# Patient Record
Sex: Female | Born: 1985 | Race: Black or African American | Hispanic: No | Marital: Single | Smoking: Former smoker
Health system: Southern US, Community
[De-identification: ages and names within clinical notes are randomized; demographics above are authoritative.]

## PROBLEM LIST (undated history)

## (undated) DIAGNOSIS — F419 Anxiety disorder, unspecified: Secondary | ICD-10-CM

---

## 2016-06-16 ENCOUNTER — Emergency Department (HOSPITAL_BASED_OUTPATIENT_CLINIC_OR_DEPARTMENT_OTHER)
Admission: EM | Admit: 2016-06-16 | Discharge: 2016-06-16 | Disposition: A | Discharge status: Home / Self Care | Payer: Medicaid Other | Attending: Emergency Medicine | Admitting: Emergency Medicine

## 2016-06-16 ENCOUNTER — Encounter (HOSPITAL_BASED_OUTPATIENT_CLINIC_OR_DEPARTMENT_OTHER): Payer: Self-pay | Admitting: Emergency Medicine

## 2016-06-16 DIAGNOSIS — R61 Generalized hyperhidrosis: Secondary | ICD-10-CM | POA: Diagnosis not present

## 2016-06-16 DIAGNOSIS — E876 Hypokalemia: Secondary | ICD-10-CM | POA: Diagnosis not present

## 2016-06-16 DIAGNOSIS — F17200 Nicotine dependence, unspecified, uncomplicated: Secondary | ICD-10-CM | POA: Diagnosis not present

## 2016-06-16 DIAGNOSIS — Z792 Long term (current) use of antibiotics: Secondary | ICD-10-CM | POA: Diagnosis not present

## 2016-06-16 DIAGNOSIS — R55 Syncope and collapse: Secondary | ICD-10-CM | POA: Insufficient documentation

## 2016-06-16 LAB — URINALYSIS, ROUTINE W REFLEX MICROSCOPIC
Bilirubin Urine: NEGATIVE
GLUCOSE, UA: NEGATIVE mg/dL
HGB URINE DIPSTICK: NEGATIVE
Ketones, ur: NEGATIVE mg/dL
Nitrite: NEGATIVE
PROTEIN: NEGATIVE mg/dL
SPECIFIC GRAVITY, URINE: 1.011 (ref 1.005–1.030)
pH: 7 (ref 5.0–8.0)

## 2016-06-16 LAB — BASIC METABOLIC PANEL
ANION GAP: 6 (ref 5–15)
BUN: 9 mg/dL (ref 6–20)
CHLORIDE: 106 mmol/L (ref 101–111)
CO2: 25 mmol/L (ref 22–32)
CREATININE: 0.88 mg/dL (ref 0.44–1.00)
Calcium: 9.2 mg/dL (ref 8.9–10.3)
GFR calc non Af Amer: >60 mL/min (ref >60)
Glucose, Bld: 99 mg/dL (ref 65–99)
POTASSIUM: 3.3 mmol/L — AB (ref 3.5–5.1)
SODIUM: 137 mmol/L (ref 135–145)

## 2016-06-16 LAB — CBC
HEMATOCRIT: 37.9 % (ref 36.0–46.0)
HEMOGLOBIN: 12.9 g/dL (ref 12.0–15.0)
MCH: 29.3 pg (ref 26.0–34.0)
MCHC: 34 g/dL (ref 30.0–36.0)
MCV: 85.9 fL (ref 78.0–100.0)
PLATELETS: 237 10*3/uL (ref 150–400)
RBC: 4.41 MIL/uL (ref 3.87–5.11)
RDW: 14.3 % (ref 11.5–15.5)
WBC: 4.5 10*3/uL (ref 4.0–10.5)

## 2016-06-16 LAB — URINE MICROSCOPIC-ADD ON

## 2016-06-16 LAB — CBG MONITORING, ED: Glucose-Capillary: 92 mg/dL (ref 65–99)

## 2016-06-16 LAB — HCG, QUANTITATIVE, PREGNANCY: hCG, Beta Chain, Quant, S: <1 m[IU]/mL (ref <5)

## 2016-06-16 MED ORDER — POTASSIUM CHLORIDE CRYS ER 20 MEQ PO TBCR
40.0000 meq | EXTENDED_RELEASE_TABLET | Freq: Once | ORAL | Status: AC
  Administered 2016-06-16: 40 meq via ORAL
  Filled 2016-06-16: qty 2

## 2016-06-16 MED ORDER — SODIUM CHLORIDE 0.9 % IV BOLUS (SEPSIS)
1000.0000 mL | Freq: Once | INTRAVENOUS | Status: AC
  Administered 2016-06-16: 1000 mL via INTRAVENOUS

## 2016-06-16 NOTE — ED Notes (Signed)
Pt is a visitor of another pt, states she was "feeling weird in the room," and walked outside to get some fresh air.  As she was on her way to her car, pt felt like she was going to pass out and was able to ask someone to get a nurse.  She was able to lie down into the grass and was brought into the department by the front desk staff.  Pt denies any injury, no injuries or trauma noted.  Pt denies previous syncopal episodes, states she has not eaten today.  She continues to c/o lightheadedness but is alert and oriented and able to actively participate in her care.  She was diaphoretic upon first assessment, is resting comfortably at this time.

## 2016-06-16 NOTE — ED Notes (Signed)
MD at bedside. 

## 2016-06-16 NOTE — ED Triage Notes (Signed)
Pt was walking out to her car in our parking lot when she states she became dizzy and hot and found herself lying in the grass.

## 2016-06-16 NOTE — ED Notes (Signed)
Pt verbalizes understanding of d/c instructions and denies any further needs at this time. 

## 2016-06-16 NOTE — ED Provider Notes (Signed)
[facility] Note   CSN: [id number] Arrival date & time: 06/16/16  1912  By signing my name below, I, Doreatha Martin, attest that this documentation has been prepared under the direction and in the presence of Rolan Bucco, MD. Electronically Signed: Doreatha Martin, ED Scribe. 06/16/16. 8:04 PM.    History   Chief Complaint Chief Complaint  Patient presents with  . Loss of Consciousness    HPI Theresa Ochoa is a 30 y.o. female who presents to the Emergency Department complaining of an episode of syncope that occurred just PTA. Pt states she was walking to her car in the ED parking lot when she became lightheaded, dizzy, diaphoretic, layed on the ground and lost consciousness for a short, unspecified, period of time. She denies falls or head injury. Pt states that she regained consciousness on the stretcher being wheeled inside. She states she had a "jittery" feeling prior to syncope, but denies palpitations of CP prior to the episode. No h/o similar syncopal episodes. Pt notes she occasionally experiences orthostatic lightheadedness for years at baseline and reports some dizziness when she smells certain chemicals. She denies known pregnancy. Pt states her periods are regular, LMP 05/28/16. She denies abdominal pain, nausea, emesis, diarrhea, recent illnesses.   The history is provided by the patient. No language interpreter was used.    History reviewed. No pertinent past medical history.  There are no active problems to display for this patient.   Past Surgical History:  Procedure Laterality Date  . CESAREAN SECTION      OB History    Gravida Para Term Preterm AB Living   1             SAB TAB Ectopic Multiple Live Births                   Home Medications    Prior to Admission medications   Medication Sig Start Date End Date Taking? Authorizing Provider  amoxicillin (AMOXIL) 500 MG capsule Take 500 mg by mouth 3 (three) times daily.   Yes Historical  Provider, MD    Family History No family history on file.  Social History Social History  Substance Use Topics  . Smoking status: Current Every Day Smoker  . Smokeless tobacco: Never Used  . Alcohol use No     Allergies   Review of patient's allergies indicates no known allergies.   Review of Systems Review of Systems  Constitutional: Positive for diaphoresis. Negative for chills, fatigue and fever.  HENT: Negative for congestion, rhinorrhea and sneezing.   Eyes: Negative.   Respiratory: Negative for cough, chest tightness and shortness of breath.   Cardiovascular: Negative for chest pain, palpitations and leg swelling.  Gastrointestinal: Negative for abdominal pain, blood in stool, diarrhea, nausea and vomiting.  Genitourinary: Negative for difficulty urinating, flank pain, frequency and hematuria.  Musculoskeletal: Negative for arthralgias and back pain.  Skin: Negative for rash.  Neurological: Positive for dizziness, syncope and light-headedness. Negative for speech difficulty, weakness, numbness and headaches.     Physical Exam Updated Vital Signs BP 101/70   Pulse 74   Temp 97.6 F (36.4 C) (Oral)   Resp 19   Ht 5\' 7"  (1.702 m)   Wt 135 lb (61.2 kg)   LMP 05/28/2016   SpO2 99%   Breastfeeding? Unknown   BMI 21.14 kg/m   Physical Exam  Constitutional: She is oriented to person, place, and time. She appears well-developed and well-nourished.  HENT:  Head:  Normocephalic and atraumatic.  Eyes: Pupils are equal, round, and reactive to light.  Neck: Normal range of motion. Neck supple.  Cardiovascular: Normal rate, regular rhythm and normal heart sounds.   Pulmonary/Chest: Effort normal and breath sounds normal. No respiratory distress. She has no wheezes. She has no rales. She exhibits no tenderness.  Abdominal: Soft. Bowel sounds are normal. There is no tenderness. There is no rebound and no guarding.  Musculoskeletal: Normal range of motion. She exhibits no  edema.  Lymphadenopathy:    She has no cervical adenopathy.  Neurological: She is alert and oriented to person, place, and time. She has normal strength. No cranial nerve deficit or sensory deficit. GCS eye subscore is 4. GCS verbal subscore is 5. GCS motor subscore is 6.  Skin: Skin is warm and dry. No rash noted.  Psychiatric: She has a normal mood and affect.  Nursing note and vitals reviewed.    ED Treatments / Results   DIAGNOSTIC STUDIES: Oxygen Saturation is 100% on RA, normal by my interpretation.    COORDINATION OF CARE: 8:03 PM Discussed treatment plan with pt at bedside which includes lab work and pt agreed to plan.    Labs (all labs ordered are listed, but only abnormal results are displayed) Labs Reviewed  BASIC METABOLIC PANEL - Abnormal; Notable for the following:       Result Value   Potassium 3.3 (*)    All other components within normal limits  URINALYSIS, ROUTINE W REFLEX MICROSCOPIC (NOT AT [facility]) - Abnormal; Notable for the following:    Leukocytes, UA TRACE (*)    All other components within normal limits  URINE MICROSCOPIC-ADD ON - Abnormal; Notable for the following:    Squamous Epithelial / LPF 0-5 (*)    Bacteria, UA FEW (*)    All other components within normal limits  CBC  HCG, QUANTITATIVE, PREGNANCY  CBG MONITORING, ED  CBG MONITORING, ED    EKG  EKG Interpretation  Date/Time:  Friday June 16 2016 19:27:36 EDT Ventricular Rate:  68 PR Interval:    QRS Duration: 89 QT Interval:  376 QTC Calculation: 400 R Axis:   75 Text Interpretation:  Sinus rhythm No old tracing to compare Confirmed by Chrisotpher Rivero  MD, Woodson Macha ([id number]) on 06/16/2016 7:30:38 PM       Radiology No results found.  Procedures Procedures (including critical care time)  Medications Ordered in ED Medications  potassium chloride SA (K-DUR,KLOR-CON) CR tablet 40 mEq (not administered)  sodium chloride 0.9 % bolus 1,000 mL (0 mLs Intravenous Stopped 06/16/16 2038)      Initial Impression / Assessment and Plan / ED Course  I have reviewed the triage vital signs and the nursing notes.  Pertinent labs & imaging results that were available during my care of the patient were reviewed by me and considered in my medical decision making (see chart for details).  Clinical Course    Patient presents after syncopal episode. She has no recent illnesses. No symptoms that sound more concerning for an arrhythmia or acute coronary syndrome. She was hypotensive on arrival but has responded nicely to IV fluids. She's feeling much better. She's ambulating without an times. She's been able to eat and drink without difficulty. Her labs are non-concerning. Her potassium is slightly low and she was given a dose of potassium in the ED. Her EKG doesn't show any evidence of arrhythmia. She was discharged home in good condition. She was encouraged to follow-up with her PCP. Return precautions  were given.  Final Clinical Impressions(s) / ED Diagnoses   Final diagnoses:  Vasovagal syncope  Hypokalemia    New Prescriptions New Prescriptions   No medications on file    I personally performed the services described in this documentation, which was scribed in my presence.  The recorded information has been reviewed and considered.    Rolan Bucco, MD 06/16/16 2159

## 2016-06-16 NOTE — ED Notes (Signed)
Pt verbalizes that she feels better.  She was wheeled to the restroom and was able to ambulate from the wheelchair to the toilet without assistance and states she felt fine.

## 2016-06-16 NOTE — ED Notes (Signed)
Pt sitting up and drinking water, eating crackers with cheese

## 2018-05-09 ENCOUNTER — Emergency Department (HOSPITAL_BASED_OUTPATIENT_CLINIC_OR_DEPARTMENT_OTHER)
Admission: EM | Admit: 2018-05-09 | Discharge: 2018-05-09 | Disposition: A | Discharge status: Home / Self Care | Payer: Medicaid Other | Attending: Emergency Medicine | Admitting: Emergency Medicine

## 2018-05-09 ENCOUNTER — Encounter (HOSPITAL_BASED_OUTPATIENT_CLINIC_OR_DEPARTMENT_OTHER): Payer: Self-pay | Admitting: Emergency Medicine

## 2018-05-09 ENCOUNTER — Other Ambulatory Visit: Payer: Self-pay

## 2018-05-09 DIAGNOSIS — F329 Major depressive disorder, single episode, unspecified: Secondary | ICD-10-CM | POA: Insufficient documentation

## 2018-05-09 DIAGNOSIS — R4586 Emotional lability: Secondary | ICD-10-CM | POA: Insufficient documentation

## 2018-05-09 DIAGNOSIS — F17200 Nicotine dependence, unspecified, uncomplicated: Secondary | ICD-10-CM | POA: Insufficient documentation

## 2018-05-09 DIAGNOSIS — F32A Depression, unspecified: Secondary | ICD-10-CM

## 2018-05-09 HISTORY — DX: Anxiety disorder, unspecified: F41.9

## 2018-05-09 LAB — CBC
HCT: 34 % — ABNORMAL LOW (ref 36.0–46.0)
HEMOGLOBIN: 11.4 g/dL — AB (ref 12.0–15.0)
MCH: 28.9 pg (ref 26.0–34.0)
MCHC: 33.5 g/dL (ref 30.0–36.0)
MCV: 86.3 fL (ref 78.0–100.0)
Platelets: 271 10*3/uL (ref 150–400)
RBC: 3.94 MIL/uL (ref 3.87–5.11)
RDW: 13.6 % (ref 11.5–15.5)
WBC: 4.8 10*3/uL (ref 4.0–10.5)

## 2018-05-09 LAB — COMPREHENSIVE METABOLIC PANEL
ALBUMIN: 4.4 g/dL (ref 3.5–5.0)
ALK PHOS: 45 U/L (ref 38–126)
ALT: 16 U/L (ref 0–44)
AST: 21 U/L (ref 15–41)
Anion gap: 9 (ref 5–15)
BUN: 8 mg/dL (ref 6–20)
CO2: 23 mmol/L (ref 22–32)
Calcium: 8.9 mg/dL (ref 8.9–10.3)
Chloride: 106 mmol/L (ref 98–111)
Creatinine, Ser: 0.78 mg/dL (ref 0.44–1.00)
GFR calc Af Amer: >60 mL/min (ref >60)
GFR calc non Af Amer: >60 mL/min (ref >60)
GLUCOSE: 81 mg/dL (ref 70–99)
POTASSIUM: 3.3 mmol/L — AB (ref 3.5–5.1)
Sodium: 138 mmol/L (ref 135–145)
Total Bilirubin: 0.3 mg/dL (ref 0.3–1.2)
Total Protein: 7.6 g/dL (ref 6.5–8.1)

## 2018-05-09 LAB — RAPID URINE DRUG SCREEN, HOSP PERFORMED
Amphetamines: NOT DETECTED
BARBITURATES: NOT DETECTED
Benzodiazepines: NOT DETECTED
Cocaine: NOT DETECTED
OPIATES: NOT DETECTED
TETRAHYDROCANNABINOL: POSITIVE — AB

## 2018-05-09 LAB — PREGNANCY, URINE: PREG TEST UR: NEGATIVE

## 2018-05-09 LAB — ETHANOL: Alcohol, Ethyl (B): 21 mg/dL — ABNORMAL HIGH (ref <10)

## 2018-05-09 NOTE — ED Triage Notes (Signed)
Pt c/o feeling depressed x 6 months. Reports feeling angry and irritable. Hx of anxiety but doesn't take medication for it. Denies SI/HI
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
## 2018-07-27 ENCOUNTER — Encounter (HOSPITAL_BASED_OUTPATIENT_CLINIC_OR_DEPARTMENT_OTHER): Payer: Self-pay | Admitting: Emergency Medicine

## 2018-07-27 ENCOUNTER — Emergency Department (HOSPITAL_BASED_OUTPATIENT_CLINIC_OR_DEPARTMENT_OTHER)
Admission: EM | Admit: 2018-07-27 | Discharge: 2018-07-27 | Disposition: A | Discharge status: Home / Self Care | Payer: Medicaid Other | Attending: Emergency Medicine | Admitting: Emergency Medicine

## 2018-07-27 ENCOUNTER — Other Ambulatory Visit: Payer: Self-pay

## 2018-07-27 DIAGNOSIS — Z3201 Encounter for pregnancy test, result positive: Secondary | ICD-10-CM | POA: Diagnosis not present

## 2018-07-27 DIAGNOSIS — O99330 Smoking (tobacco) complicating pregnancy, unspecified trimester: Secondary | ICD-10-CM | POA: Insufficient documentation

## 2018-07-27 DIAGNOSIS — F17200 Nicotine dependence, unspecified, uncomplicated: Secondary | ICD-10-CM | POA: Diagnosis not present

## 2018-07-27 DIAGNOSIS — Z3A00 Weeks of gestation of pregnancy not specified: Secondary | ICD-10-CM | POA: Insufficient documentation

## 2018-07-27 DIAGNOSIS — O219 Vomiting of pregnancy, unspecified: Secondary | ICD-10-CM | POA: Insufficient documentation

## 2018-07-27 DIAGNOSIS — R42 Dizziness and giddiness: Secondary | ICD-10-CM | POA: Diagnosis present

## 2018-07-27 LAB — URINALYSIS, ROUTINE W REFLEX MICROSCOPIC
Bilirubin Urine: NEGATIVE
Glucose, UA: NEGATIVE mg/dL
Hgb urine dipstick: NEGATIVE
KETONES UR: 15 mg/dL — AB
Leukocytes, UA: NEGATIVE
NITRITE: NEGATIVE
PROTEIN: NEGATIVE mg/dL
Specific Gravity, Urine: >1.03 — ABNORMAL HIGH (ref 1.005–1.030)
pH: 6 (ref 5.0–8.0)

## 2018-07-27 LAB — PREGNANCY, URINE: Preg Test, Ur: POSITIVE — AB

## 2018-07-27 NOTE — Discharge Instructions (Addendum)
Call your OB doctor to arrange appointment next week. Return to the ER if you develop abdominal pain, vaginal bleeding, passout or new or concerning symptoms.  Avoid alcohol or drugs.  Start taking prenatal vitamin.

## 2018-07-27 NOTE — ED Notes (Signed)
Pt reports not felling well for the past week and a half. Pt reports intermittent nausea and dizziness as well as general malaise. Pt denies any pain.

## 2018-07-27 NOTE — ED Triage Notes (Signed)
Patient states that at about 4 am - 5 am  she feels like she is throwing up and really hot. The patient reports that she is dizzy

## 2018-07-27 NOTE — ED Notes (Signed)
Patient verbalizes understanding of discharge instructions. Opportunity for questioning and answers were provided. Armband removed by staff, pt discharged from ED home via POV.  

## 2018-07-27 NOTE — ED Provider Notes (Signed)
[facility] EMERGENCY DEPARTMENT Provider Note   CSN: [id number] Arrival date & time: 07/27/18  1742     History   Chief Complaint Chief Complaint  Patient presents with  . Dizziness    HPI Theresa Ochoa is a 32 y.o. female.  Patient presents with malaise, nausea and vomiting for the past week.  No new headaches, no vaginal bleeding, no focal abdominal pain.  No urinary symptoms.  Patient has a history of 5 pregnancies, 2 abortions.  No ectopic history.     Past Medical History:  Diagnosis Date  . Anxiety     There are no active problems to display for this patient.   Past Surgical History:  Procedure Laterality Date  . CESAREAN SECTION    . CESAREAN SECTION       OB History    Gravida  1   Para      Term      Preterm      AB      Living        SAB      TAB      Ectopic      Multiple      Live Births               Home Medications    Prior to Admission medications   Medication Sig Start Date End Date Taking? Authorizing Provider  amoxicillin (AMOXIL) 500 MG capsule Take 500 mg by mouth 3 (three) times daily.    [provider]    Family History History reviewed. No pertinent family history.  Social History Social History   Tobacco Use  . Smoking status: Current Every Day Smoker  . Smokeless tobacco: Never Used  Substance Use Topics  . Alcohol use: Yes    Comment: occ  . Drug use: Yes    Types: Marijuana     Allergies   Patient has no known allergies.   Review of Systems Review of Systems  Constitutional: Positive for appetite change. Negative for chills and fever.  HENT: Negative for congestion.   Eyes: Negative for visual disturbance.  Respiratory: Negative for shortness of breath.   Cardiovascular: Negative for chest pain.  Gastrointestinal: Positive for nausea and vomiting. Negative for abdominal pain.  Genitourinary: Negative for dysuria and flank pain.  Musculoskeletal: Negative for back  pain, neck pain and neck stiffness.  Skin: Negative for rash.  Neurological: Positive for light-headedness. Negative for headaches.     Physical Exam Updated Vital Signs BP 126/90 (BP Location: Left Arm)   Pulse 84   Temp 98.5 F (36.9 C) (Oral)   Resp 18   Ht 5\' 7"  (1.702 m)   Wt 65.8 kg   LMP 06/09/2018   SpO2 100%   BMI 22.71 kg/m   Physical Exam  Constitutional: She is oriented to person, place, and time. She appears well-developed and well-nourished.  HENT:  Head: Normocephalic and atraumatic.  Mild dry mucous membranes  Eyes: Conjunctivae are normal. Right eye exhibits no discharge. Left eye exhibits no discharge.  Neck: Normal range of motion. Neck supple. No tracheal deviation present.  Cardiovascular: Normal rate.  Pulmonary/Chest: Effort normal.  Abdominal: Soft. She exhibits no distension. There is no tenderness. There is no guarding.  Musculoskeletal: She exhibits no edema.  Neurological: She is alert and oriented to person, place, and time.  Skin: Skin is warm. No rash noted.  Psychiatric: She has a normal mood and affect.  Nursing note and vitals reviewed.  ED Treatments / Results  Labs (all labs ordered are listed, but only abnormal results are displayed) Labs Reviewed  URINALYSIS, ROUTINE W REFLEX MICROSCOPIC - Abnormal; Notable for the following components:      Result Value   Specific Gravity, Urine >1.030 (*)    Ketones, ur 15 (*)    All other components within normal limits  PREGNANCY, URINE - Abnormal; Notable for the following components:   Preg Test, Ur POSITIVE (*)    All other components within normal limits    EKG None  Radiology No results found.  Procedures Procedures (including critical care time)  Medications Ordered in ED Medications - No data to display   Initial Impression / Assessment and Plan / ED Course  I have reviewed the triage vital signs and the nursing notes.  Pertinent labs & imaging results that were  available during my care of the patient were reviewed by me and considered in my medical decision making (see chart for details).    Patient presents with general malaise, urinalysis reviewed mild ketones no sign of infection.  Patient's pregnancy test is positive.  This is new information for the patient however now makes sense clinically for her.  Patient has a husband and is having no abdominal pain or vaginal bleeding.  Patient does not need any emergent imaging at this time.  Discussed follow-up with OB/GYN this week and reasons to return.  Final Clinical Impressions(s) / ED Diagnoses   Final diagnoses:  Vomiting pregnancy    ED Discharge Orders    None       Blane Ohara, MD 07/27/18 1943

## 2018-10-08 ENCOUNTER — Emergency Department (HOSPITAL_BASED_OUTPATIENT_CLINIC_OR_DEPARTMENT_OTHER)
Admission: EM | Admit: 2018-10-08 | Discharge: 2018-10-08 | Disposition: A | Discharge status: Home / Self Care | Payer: Medicaid Other | Source: Home / Self Care | Attending: Emergency Medicine | Admitting: Emergency Medicine

## 2018-10-08 ENCOUNTER — Encounter (HOSPITAL_BASED_OUTPATIENT_CLINIC_OR_DEPARTMENT_OTHER): Payer: Self-pay | Admitting: Emergency Medicine

## 2018-10-08 ENCOUNTER — Other Ambulatory Visit: Payer: Self-pay

## 2018-10-08 ENCOUNTER — Emergency Department (HOSPITAL_BASED_OUTPATIENT_CLINIC_OR_DEPARTMENT_OTHER): Payer: Medicaid Other

## 2018-10-08 ENCOUNTER — Emergency Department (HOSPITAL_BASED_OUTPATIENT_CLINIC_OR_DEPARTMENT_OTHER)
Admission: EM | Admit: 2018-10-08 | Discharge: 2018-10-08 | Disposition: A | Discharge status: Home / Self Care | Payer: Medicaid Other | Attending: Emergency Medicine | Admitting: Emergency Medicine

## 2018-10-08 DIAGNOSIS — R52 Pain, unspecified: Secondary | ICD-10-CM | POA: Diagnosis present

## 2018-10-08 DIAGNOSIS — J111 Influenza due to unidentified influenza virus with other respiratory manifestations: Secondary | ICD-10-CM

## 2018-10-08 DIAGNOSIS — R69 Illness, unspecified: Principal | ICD-10-CM

## 2018-10-08 DIAGNOSIS — Z87891 Personal history of nicotine dependence: Secondary | ICD-10-CM

## 2018-10-08 DIAGNOSIS — Z3A00 Weeks of gestation of pregnancy not specified: Secondary | ICD-10-CM

## 2018-10-08 DIAGNOSIS — O98519 Other viral diseases complicating pregnancy, unspecified trimester: Secondary | ICD-10-CM | POA: Insufficient documentation

## 2018-10-08 DIAGNOSIS — Z5321 Procedure and treatment not carried out due to patient leaving prior to being seen by health care provider: Secondary | ICD-10-CM | POA: Insufficient documentation

## 2018-10-08 DIAGNOSIS — Z209 Contact with and (suspected) exposure to unspecified communicable disease: Secondary | ICD-10-CM | POA: Insufficient documentation

## 2018-10-08 NOTE — Discharge Instructions (Signed)
Drink fluids,, rest, you can take Tylenol for aches and pains

## 2018-10-08 NOTE — ED Provider Notes (Signed)
[facility] EMERGENCY DEPARTMENT Provider Note   CSN: [id number] Arrival date & time: 10/08/18  0732     History   Chief Complaint Chief Complaint  Patient presents with  . flu-like symptoms    HPI Theresa Ochoa is a 32 y.o. female.  HPI Patient presents to the emergency room for evaluation of cough, congestion, body aches, and sore throat.  Patient states her symptoms started about a week ago.  She has had persistent cough and congestion.  She has myalgias.  She also now started having pain in her center of her chest especially with coughing.  She has not had any fevers and she denies any shortness of breath.  Patient stepchild is currently hospitalized in the ICU due to influenza and complications.  She denies any vomiting or diarrhea.  No known recent fevers. Past Medical History:  Diagnosis Date  . Anxiety     There are no active problems to display for this patient.   Past Surgical History:  Procedure Laterality Date  . CESAREAN SECTION    . CESAREAN SECTION       OB History    Gravida  2   Para      Term      Preterm      AB      Living        SAB      TAB      Ectopic      Multiple      Live Births               Home Medications    Prior to Admission medications   Medication Sig Start Date End Date Taking? Authorizing Provider  Prenatal Vit-Fe Fumarate-FA (PNV PRENATAL PLUS MULTIVITAMIN) 27-1 MG TABS Take by mouth.    [provider]    Family History No family history on file.  Social History Social History   Tobacco Use  . Smoking status: Former Games developermoker  . Smokeless tobacco: Never Used  Substance Use Topics  . Alcohol use: Yes    Comment: occ  . Drug use: Not Currently    Types: Marijuana     Allergies   Patient has no known allergies.   Review of Systems Review of Systems  All other systems reviewed and are negative.    Physical Exam Updated Vital Signs BP 99/69 (BP Location: Left Arm)    Pulse 88   Temp 98 F (36.7 C) (Oral)   Resp 16   Ht 1.702 m (5\' 7" )   Wt 75 kg   LMP 06/09/2018   SpO2 100%   BMI 25.90 kg/m   Physical Exam Vitals signs and nursing note reviewed.  Constitutional:      General: She is not in acute distress.    Appearance: She is well-developed.  HENT:     Head: Normocephalic and atraumatic.     Right Ear: External ear normal.     Left Ear: External ear normal.  Eyes:     General: No scleral icterus.       Right eye: No discharge.        Left eye: No discharge.     Conjunctiva/sclera: Conjunctivae normal.  Neck:     Musculoskeletal: Neck supple.     Trachea: No tracheal deviation.  Cardiovascular:     Rate and Rhythm: Normal rate and regular rhythm.  Pulmonary:     Effort: Pulmonary effort is normal. No respiratory distress.  Breath sounds: Normal breath sounds. No stridor. No wheezing or rales.  Abdominal:     General: Bowel sounds are normal. There is no distension.     Palpations: Abdomen is soft.     Tenderness: There is no abdominal tenderness. There is no guarding or rebound.     Comments: Gravid uterus  Musculoskeletal:        General: No tenderness.  Skin:    General: Skin is warm and dry.     Findings: No rash.  Neurological:     Mental Status: She is alert.     Cranial Nerves: No cranial nerve deficit (no facial droop, extraocular movements intact, no slurred speech).     Sensory: No sensory deficit.     Motor: No abnormal muscle tone or seizure activity.     Coordination: Coordination normal.      ED Treatments / Results  Labs (all labs ordered are listed, but only abnormal results are displayed) Labs Reviewed - No data to display  EKG None  Radiology Dg Chest 2 View  Result Date: 10/08/2018 CLINICAL DATA:  Chest pain and cough EXAM: CHEST - 2 VIEW COMPARISON:  None. FINDINGS: Lungs are clear. Heart size and pulmonary vascularity are normal. No adenopathy. No pneumothorax. No bone lesions. IMPRESSION: No  edema or consolidation. Electronically Signed   By: Bretta BangWilliam  Woodruff III M.D.   On: 10/08/2018 08:14    Procedures Procedures (including critical care time)  Medications Ordered in ED Medications - No data to display   Initial Impression / Assessment and Plan / ED Course  I have reviewed the triage vital signs and the nursing notes.  Pertinent labs & imaging results that were available during my care of the patient were reviewed by me and considered in my medical decision making (see chart for details).   Patient symptoms are consistent with an influenza-like illness.  No pneumonia on x-ray.  Patient is pregnant so she really cannot safely take medications for her cough.  She can take Tylenol for aches and pains.  Supportive care.  Final Clinical Impressions(s) / ED Diagnoses   Final diagnoses:  Influenza-like illness    ED Discharge Orders    None       Linwood DibblesKnapp, Seanmichael Salmons, MD 10/08/18 [phone]

## 2018-10-08 NOTE — ED Triage Notes (Signed)
Cough, sinus congestion, runny nose, sore throat, body aches x1 week.  Sts step-child is hospitalized currently for flu.

## 2018-10-08 NOTE — ED Notes (Signed)
Called to take to room  No response from lobby  

## 2018-10-08 NOTE — ED Notes (Signed)
Patient transported to X-ray 

## 2018-10-08 NOTE — ED Notes (Signed)
Pt called to take to treatment room  No response from lobby 

## 2018-10-08 NOTE — ED Triage Notes (Signed)
Pt c/o generalized body ache for the past few days not getting any better wanted know if she has the flu.

## 2018-10-09 NOTE — L&D Delivery Note (Signed)
OB/GYN Faculty Practice Delivery Note  Theresa Ochoa is a 33 y.o. [id number] s/p SVD at [redacted]w[redacted]d. She was admitted for IOL for IUFD in setting of presumed placental abruption and DIC.   ROM: 1h 72m with bloody fluid Maximum Maternal Temperature: Temp (24hrs), Avg:98.7 F (37.1 C), Min:98.2 F (36.8 C), Max:98.9 F (37.2 C)  Labor Progress: . Admitted for induction . Epidural placed . FB placed > out within a couple of hours . AROM, pitocin started  . Progressed to 9.5 and feeling lots of pressure  Delivery Date/Time: 11/16/18 at 0806 Delivery: Called to room and patient was feeling lots of pressure. Exam with fetal station of +2. Presenting part is left arm and shoulders, back up. Attempted to reduce arm, unsuccessful so had patient bear down and push. Body delivered with shoulders as presenting part. Rest of body delivered in usual fashion, large amount of old clot followed. Placenta delivered spontaneously with gentle cord traction. Fundus firm with massage and Pitocin. Labia, perineum, vagina, and cervix inspected inspected with no lacerations.   Placenta: spontaneous, intact, 3-vessel cord  Complications: DIC and worsening HgB of 6.5 immediately postpartum - receiving cryoprecipitate and ordered for 2U pRBCs to be given  methergine given with degree of blood clots, uterus firm with postpartum pitocin running  Lacerations: none EBL: 550cc Analgesia: epidural  Fetus: IUFD - family declines fetal autopsy, appreciate RN assistance with helping mother hold infant   Cristal Deer. Earlene Plater, DO OB/GYN Fellow, Faculty Practice

## 2018-11-15 ENCOUNTER — Encounter (HOSPITAL_COMMUNITY): Payer: Self-pay

## 2018-11-15 ENCOUNTER — Inpatient Hospital Stay (HOSPITAL_COMMUNITY)
Admission: EM | Admit: 2018-11-15 | Discharge: 2018-11-18 | DRG: 805 | Disposition: A | Discharge status: Home / Self Care | Payer: Medicaid Other | Attending: Obstetrics & Gynecology | Admitting: Obstetrics & Gynecology

## 2018-11-15 ENCOUNTER — Other Ambulatory Visit: Payer: Self-pay

## 2018-11-15 DIAGNOSIS — O4692 Antepartum hemorrhage, unspecified, second trimester: Secondary | ICD-10-CM

## 2018-11-15 DIAGNOSIS — Z3A25 25 weeks gestation of pregnancy: Secondary | ICD-10-CM

## 2018-11-15 DIAGNOSIS — O364XX0 Maternal care for intrauterine death, not applicable or unspecified: Principal | ICD-10-CM | POA: Diagnosis present

## 2018-11-15 DIAGNOSIS — N939 Abnormal uterine and vaginal bleeding, unspecified: Secondary | ICD-10-CM

## 2018-11-15 DIAGNOSIS — O4700 False labor before 37 completed weeks of gestation, unspecified trimester: Secondary | ICD-10-CM

## 2018-11-15 DIAGNOSIS — Z87891 Personal history of nicotine dependence: Secondary | ICD-10-CM

## 2018-11-15 DIAGNOSIS — D65 Disseminated intravascular coagulation [defibrination syndrome]: Secondary | ICD-10-CM | POA: Diagnosis present

## 2018-11-15 DIAGNOSIS — O9902 Anemia complicating childbirth: Secondary | ICD-10-CM | POA: Diagnosis present

## 2018-11-15 DIAGNOSIS — O479 False labor, unspecified: Secondary | ICD-10-CM

## 2018-11-15 DIAGNOSIS — D649 Anemia, unspecified: Secondary | ICD-10-CM | POA: Diagnosis present

## 2018-11-15 DIAGNOSIS — O99012 Anemia complicating pregnancy, second trimester: Secondary | ICD-10-CM | POA: Diagnosis present

## 2018-11-15 DIAGNOSIS — O4592 Premature separation of placenta, unspecified, second trimester: Secondary | ICD-10-CM | POA: Diagnosis present

## 2018-11-15 DIAGNOSIS — O45022 Premature separation of placenta with disseminated intravascular coagulation, second trimester: Secondary | ICD-10-CM | POA: Diagnosis present

## 2018-11-15 MED ORDER — SODIUM CHLORIDE 0.9 % IV BOLUS (SEPSIS)
1000.0000 mL | Freq: Once | INTRAVENOUS | Status: AC
  Administered 2018-11-15: 1000 mL via INTRAVENOUS

## 2018-11-15 MED ORDER — ACETAMINOPHEN 500 MG PO TABS
1000.0000 mg | ORAL_TABLET | Freq: Once | ORAL | Status: AC
  Administered 2018-11-15: 1000 mg via ORAL
  Filled 2018-11-15: qty 2

## 2018-11-15 NOTE — ED Triage Notes (Signed)
Pt here G4 P3 with EDD of May 22nd.  Started having contractions 2 hours ago.  States getting closer and closer. All previous delivers were via C Section.

## 2018-11-15 NOTE — ED Notes (Signed)
Rapid OB called  

## 2018-11-15 NOTE — ED Provider Notes (Addendum)
TIME SEEN: 11:29 PM  CHIEF COMPLAINT: Abdominal pain  HPI: Patient is a G4, P3 due on May 22 followed by Dr. Arther AbbottHenry Dorn who presents to the emergency department with abdominal pain.  States she feels like she is having contractions.  States that started 2 hours prior to arrival and they are 7 minutes apart and lasting 2 minutes.  She states they are regular.  Did have some increased clear vaginal discharge yesterday but no vaginal bleeding.  No abnormal discharge.  No dysuria, hematuria.  Denies leakage of amniotic fluid.  No fevers, nausea, vomiting or diarrhea.  She has been feeling her baby move.  States [facility] because [facility]  ROS: See HPI Constitutional: no fever  Eyes: no drainage  ENT: no runny nose   Cardiovascular:  no chest pain  Resp: no SOB  GI: no vomiting GU: no dysuria Integumentary: no rash  Allergy: no hives  Musculoskeletal: no leg swelling  Neurological: no slurred speech ROS otherwise negative  PAST MEDICAL HISTORY/PAST SURGICAL HISTORY:  Past Medical History:  Diagnosis Date  . Anxiety     MEDICATIONS:  Prior to Admission medications   Medication Sig Start Date End Date Taking? Authorizing Provider  Prenatal Vit-Fe Fumarate-FA (PNV PRENATAL PLUS MULTIVITAMIN) 27-1 MG TABS Take by mouth.    [provider]    ALLERGIES:  No Known Allergies  SOCIAL HISTORY:  Social History   Tobacco Use  . Smoking status: Former Games developermoker  . Smokeless tobacco: Never Used  Substance Use Topics  . Alcohol use: Yes    Comment: occ    FAMILY HISTORY: History reviewed. No pertinent family history.  EXAM: BP 116/71   Pulse 93   Temp 98.2 F (36.8 C) (Oral)   Resp (!) 27   LMP 06/09/2018   SpO2 100%  CONSTITUTIONAL: Alert and oriented and responds appropriately to questions.  Appears uncomfortable but not in distress, afebrile HEAD: Normocephalic EYES: Conjunctivae clear, pupils appear equal,  EOMI ENT: normal nose; moist mucous membranes NECK: Supple, no meningismus, no nuchal rigidity, no LAD  CARD: RRR; S1 and S2 appreciated; no murmurs, no clicks, no rubs, no gallops RESP: Normal chest excursion without splinting or tachypnea; breath sounds clear and equal bilaterally; no wheezes, no rhonchi, no rales, no hypoxia or respiratory distress, speaking full sentences ABD/GI: Normal bowel sounds; gravid uterus, soft, mildly tender to palpation throughout the lower abdomen, no peritoneal signs BACK:  The back appears normal and is non-tender to palpation, there is no CVA tenderness EXT: Normal ROM in all joints; non-tender to palpation; no edema; normal capillary refill; no cyanosis, no calf tenderness or swelling    SKIN: Normal color for age and race; warm; no rash NEURO: Moves all extremities equally PSYCH: The patient's mood and manner are appropriate. Grooming and personal hygiene are appropriate.  MEDICAL DECISION MAKING: Patient here with abdominal pain.  She feels like she is having contractions.  Patient is currently [redacted] weeks pregnant.  No discharge or bleeding.  Rapid OB response nurse has been called for evaluation.  Fetal heart tones in the 120s.  She is feeling fetal movement.  Will give IV fluids, Tylenol.  Will obtain urinalysis.  Blood pressure normal.  ED PROGRESS: Rapid OB nurse at bedside with patient.  Patient is having some contractions.  They are continuing IV hydration.  Per OB nurse patient is at a -3 station, 70% effaced, only fingertip dilated.   I  was called to the room emergently as patient began having vaginal bleeding.  On my sterile speculum exam she has moderate to severe amount of vaginal bleeding and passing clots but no obvious tissue.  Unfortunately she is bleeding so heavily that I am not able to visualize her cervical os.  I am able to see the top of the cervix.  Patient is unable to tolerate speculum exam without significant discomfort.  I did send a wet  prep and gonorrhea and Chlamydia cultures per OB recommendations.  Discussed my findings with the OB/GYN on-call Dr. Macon Large who recommend transferring the patient to [facility].  Patient is currently hemodynamically stable and [facility] is at bedside for transport.  Patient denies history of placenta previa.  I am concerned for preterm labor, placental abruption, placenta previa.  Given patient stable at this time, she will be transported emergently to [facility] where OB is present.   I reviewed all nursing notes, vitals, pertinent previous records, EKGs, lab and urine results, imaging (as available).      Syrenity Klepacki, Layla Maw, DO 11/16/18 0209   Addendum:  Note updated with my pelvic exam performed just prior to [facility] transporting patient to [facility] where OB present.  [facility] getting report from nurse as I performed exam to ensure patient stability prior to transport given patient reported to rapid response OB nurse that she felt she was bleeding heavily.  Patient immediately placed on [facility] stretcher as soon as exam complete and vitals reassessed to ensure patient stable.  OB attending was updated by myself immediately after my exam.  GU:  Normal external genitalia. No lesions, rashes noted. Patient has large amount of dark red blood with clots in vaginal canal.  Due to bleeding, I am unable to visualize cervical os.  No tissue seen in canal.  Patient has significant discomfort with pelvic exam.  Sterile speculum used.  No bimanual exam performed.  Chaperone present for exam.     Kenyon Eichelberger, Layla Maw, DO 11/16/18 9528    CRITICAL CARE Performed by: Baxter Hire Jevan Gaunt   Total critical care time: 50 minutes  Critical care time was exclusive of separately billable procedures and treating other patients.  Critical care was necessary to treat or prevent imminent or life-threatening deterioration.  Critical care was time spent personally by me on the following activities:  development of treatment plan with patient and/or surrogate as well as nursing, discussions with consultants, evaluation of patient's response to treatment, examination of patient, obtaining history from patient or surrogate, ordering and performing treatments and interventions, ordering and review of laboratory studies, ordering and review of radiographic studies, pulse oximetry and re-evaluation of patient's condition.    Briarrose Shor, Layla Maw, DO 11/25/18 [phone]

## 2018-11-16 ENCOUNTER — Encounter (HOSPITAL_COMMUNITY): Payer: Self-pay | Admitting: Family Medicine

## 2018-11-16 ENCOUNTER — Inpatient Hospital Stay (HOSPITAL_BASED_OUTPATIENT_CLINIC_OR_DEPARTMENT_OTHER): Payer: Medicaid Other

## 2018-11-16 ENCOUNTER — Inpatient Hospital Stay (HOSPITAL_COMMUNITY): Payer: Medicaid Other | Admitting: Anesthesiology

## 2018-11-16 DIAGNOSIS — Z3A25 25 weeks gestation of pregnancy: Secondary | ICD-10-CM

## 2018-11-16 DIAGNOSIS — D649 Anemia, unspecified: Secondary | ICD-10-CM | POA: Diagnosis present

## 2018-11-16 DIAGNOSIS — O45022 Premature separation of placenta with disseminated intravascular coagulation, second trimester: Secondary | ICD-10-CM | POA: Diagnosis present

## 2018-11-16 DIAGNOSIS — O364XX0 Maternal care for intrauterine death, not applicable or unspecified: Secondary | ICD-10-CM | POA: Diagnosis present

## 2018-11-16 DIAGNOSIS — O99012 Anemia complicating pregnancy, second trimester: Secondary | ICD-10-CM | POA: Diagnosis present

## 2018-11-16 DIAGNOSIS — Z87891 Personal history of nicotine dependence: Secondary | ICD-10-CM | POA: Diagnosis not present

## 2018-11-16 DIAGNOSIS — O4592 Premature separation of placenta, unspecified, second trimester: Secondary | ICD-10-CM | POA: Diagnosis present

## 2018-11-16 DIAGNOSIS — O9912 Other diseases of the blood and blood-forming organs and certain disorders involving the immune mechanism complicating childbirth: Secondary | ICD-10-CM | POA: Diagnosis not present

## 2018-11-16 DIAGNOSIS — O4692 Antepartum hemorrhage, unspecified, second trimester: Secondary | ICD-10-CM | POA: Diagnosis not present

## 2018-11-16 DIAGNOSIS — D65 Disseminated intravascular coagulation [defibrination syndrome]: Secondary | ICD-10-CM | POA: Diagnosis present

## 2018-11-16 DIAGNOSIS — O9902 Anemia complicating childbirth: Secondary | ICD-10-CM | POA: Diagnosis present

## 2018-11-16 DIAGNOSIS — O26892 Other specified pregnancy related conditions, second trimester: Secondary | ICD-10-CM

## 2018-11-16 DIAGNOSIS — R109 Unspecified abdominal pain: Secondary | ICD-10-CM | POA: Diagnosis present

## 2018-11-16 LAB — COMPREHENSIVE METABOLIC PANEL
ALBUMIN: 2.6 g/dL — AB (ref 3.5–5.0)
ALT: 26 U/L (ref 0–44)
ALT: 24 U/L (ref 0–44)
ALT: 22 U/L (ref 0–44)
AST: 27 U/L (ref 15–41)
AST: 27 U/L (ref 15–41)
AST: 26 U/L (ref 15–41)
Albumin: 2.7 g/dL — ABNORMAL LOW (ref 3.5–5.0)
Albumin: 2.5 g/dL — ABNORMAL LOW (ref 3.5–5.0)
Alkaline Phosphatase: 57 U/L (ref 38–126)
Alkaline Phosphatase: 66 U/L (ref 38–126)
Alkaline Phosphatase: 54 U/L (ref 38–126)
Anion gap: 7 (ref 5–15)
Anion gap: 6 (ref 5–15)
Anion gap: 5 (ref 5–15)
BUN: 13 mg/dL (ref 6–20)
BUN: 12 mg/dL (ref 6–20)
BUN: 7 mg/dL (ref 6–20)
CALCIUM: 7.8 mg/dL — AB (ref 8.9–10.3)
CHLORIDE: 106 mmol/L (ref 98–111)
CO2: 16 mmol/L — ABNORMAL LOW (ref 22–32)
CO2: 22 mmol/L (ref 22–32)
CO2: 23 mmol/L (ref 22–32)
Calcium: 8.1 mg/dL — ABNORMAL LOW (ref 8.9–10.3)
Calcium: 7.8 mg/dL — ABNORMAL LOW (ref 8.9–10.3)
Chloride: 111 mmol/L (ref 98–111)
Chloride: 108 mmol/L (ref 98–111)
Creatinine, Ser: 0.71 mg/dL (ref 0.44–1.00)
Creatinine, Ser: 0.69 mg/dL (ref 0.44–1.00)
Creatinine, Ser: 0.69 mg/dL (ref 0.44–1.00)
GFR calc Af Amer: >60 mL/min (ref >60)
GFR calc Af Amer: >60 mL/min (ref >60)
GFR calc Af Amer: >60 mL/min (ref >60)
GFR calc non Af Amer: >60 mL/min (ref >60)
GFR calc non Af Amer: >60 mL/min (ref >60)
GFR calc non Af Amer: >60 mL/min (ref >60)
Glucose, Bld: 94 mg/dL (ref 70–99)
Glucose, Bld: 102 mg/dL — ABNORMAL HIGH (ref 70–99)
Glucose, Bld: 89 mg/dL (ref 70–99)
Potassium: 3.9 mmol/L (ref 3.5–5.1)
Potassium: 3.8 mmol/L (ref 3.5–5.1)
Potassium: 3.6 mmol/L (ref 3.5–5.1)
Sodium: 134 mmol/L — ABNORMAL LOW (ref 135–145)
Sodium: 134 mmol/L — ABNORMAL LOW (ref 135–145)
Sodium: 136 mmol/L (ref 135–145)
Total Bilirubin: 0.8 mg/dL (ref 0.3–1.2)
Total Bilirubin: 0.7 mg/dL (ref 0.3–1.2)
Total Bilirubin: 0.4 mg/dL (ref 0.3–1.2)
Total Protein: 5.6 g/dL — ABNORMAL LOW (ref 6.5–8.1)
Total Protein: 5.5 g/dL — ABNORMAL LOW (ref 6.5–8.1)
Total Protein: 5.1 g/dL — ABNORMAL LOW (ref 6.5–8.1)

## 2018-11-16 LAB — DIC (DISSEMINATED INTRAVASCULAR COAGULATION)PANEL
D-Dimer, Quant: >20 ug/mL-FEU — ABNORMAL HIGH (ref 0.00–0.50)
D-Dimer, Quant: >20 ug/mL-FEU — ABNORMAL HIGH (ref 0.00–0.50)
D-Dimer, Quant: >20 ug/mL-FEU — ABNORMAL HIGH (ref 0.00–0.50)
Fibrinogen: 92 mg/dL — CL (ref 210–475)
Fibrinogen: 92 mg/dL — CL (ref 210–475)
Fibrinogen: 174 mg/dL — ABNORMAL LOW (ref 210–475)
INR: 1.26
INR: 1.21
INR: 1.15
Platelets: 108 10*3/uL — ABNORMAL LOW (ref 150–400)
Platelets: 98 10*3/uL — ABNORMAL LOW (ref 150–400)
Prothrombin Time: 15.7 seconds — ABNORMAL HIGH (ref 11.4–15.2)
Prothrombin Time: 15.2 seconds (ref 11.4–15.2)
Prothrombin Time: 14.6 seconds (ref 11.4–15.2)
Smear Review: NONE SEEN
aPTT: 38 seconds — ABNORMAL HIGH (ref 24–36)
aPTT: 38 seconds — ABNORMAL HIGH (ref 24–36)
aPTT: 31 seconds (ref 24–36)

## 2018-11-16 LAB — CBC
HCT: 29.3 % — ABNORMAL LOW (ref 36.0–46.0)
HCT: 25.5 % — ABNORMAL LOW (ref 36.0–46.0)
HCT: 19.9 % — ABNORMAL LOW (ref 36.0–46.0)
HEMATOCRIT: 23.2 % — AB (ref 36.0–46.0)
HEMOGLOBIN: 8 g/dL — AB (ref 12.0–15.0)
HEMOGLOBIN: 7.6 g/dL — AB (ref 12.0–15.0)
Hemoglobin: 9 g/dL — ABNORMAL LOW (ref 12.0–15.0)
Hemoglobin: 6.5 g/dL — CL (ref 12.0–15.0)
MCH: 26.4 pg (ref 26.0–34.0)
MCH: 27.2 pg (ref 26.0–34.0)
MCH: 27.4 pg (ref 26.0–34.0)
MCH: 27 pg (ref 26.0–34.0)
MCHC: 30.7 g/dL (ref 30.0–36.0)
MCHC: 31.4 g/dL (ref 30.0–36.0)
MCHC: 32.7 g/dL (ref 30.0–36.0)
MCHC: 32.8 g/dL (ref 30.0–36.0)
MCV: 85.9 fL (ref 80.0–100.0)
MCV: 86.7 fL (ref 80.0–100.0)
MCV: 84 fL (ref 80.0–100.0)
MCV: 82.6 fL (ref 80.0–100.0)
PLATELETS: 155 10*3/uL (ref 150–400)
Platelets: 127 10*3/uL — ABNORMAL LOW (ref 150–400)
Platelets: 100 10*3/uL — ABNORMAL LOW (ref 150–400)
Platelets: 85 10*3/uL — ABNORMAL LOW (ref 150–400)
RBC: 3.41 MIL/uL — ABNORMAL LOW (ref 3.87–5.11)
RBC: 2.94 MIL/uL — ABNORMAL LOW (ref 3.87–5.11)
RBC: 2.37 MIL/uL — ABNORMAL LOW (ref 3.87–5.11)
RBC: 2.81 MIL/uL — ABNORMAL LOW (ref 3.87–5.11)
RDW: 14.6 % (ref 11.5–15.5)
RDW: 14.9 % (ref 11.5–15.5)
RDW: 14.8 % (ref 11.5–15.5)
RDW: 14.8 % (ref 11.5–15.5)
WBC: 8.2 10*3/uL (ref 4.0–10.5)
WBC: 10.4 10*3/uL (ref 4.0–10.5)
WBC: 11.4 10*3/uL — ABNORMAL HIGH (ref 4.0–10.5)
WBC: 10 10*3/uL (ref 4.0–10.5)
nRBC: 0 % (ref 0.0–0.2)
nRBC: 0 % (ref 0.0–0.2)
nRBC: 0 % (ref 0.0–0.2)
nRBC: 0.2 % (ref 0.0–0.2)

## 2018-11-16 LAB — DIC (DISSEMINATED INTRAVASCULAR COAGULATION) PANEL: PLATELETS: 84 10*3/uL — AB (ref 150–400)

## 2018-11-16 LAB — RAPID URINE DRUG SCREEN, HOSP PERFORMED
Amphetamines: NOT DETECTED
Barbiturates: NOT DETECTED
Benzodiazepines: NOT DETECTED
Cocaine: NOT DETECTED
Opiates: NOT DETECTED
Tetrahydrocannabinol: POSITIVE — AB

## 2018-11-16 LAB — URINALYSIS, ROUTINE W REFLEX MICROSCOPIC
BILIRUBIN URINE: NEGATIVE
Glucose, UA: NEGATIVE mg/dL
Hgb urine dipstick: NEGATIVE
Ketones, ur: NEGATIVE mg/dL
Leukocytes, UA: NEGATIVE
Nitrite: NEGATIVE
PH: 6 (ref 5.0–8.0)
Protein, ur: NEGATIVE mg/dL
SPECIFIC GRAVITY, URINE: 1.014 (ref 1.005–1.030)

## 2018-11-16 LAB — WET PREP, GENITAL
Clue Cells Wet Prep HPF POC: NONE SEEN
Sperm: NONE SEEN
Trich, Wet Prep: NONE SEEN
Yeast Wet Prep HPF POC: NONE SEEN

## 2018-11-16 LAB — PREPARE RBC (CROSSMATCH)

## 2018-11-16 LAB — TSH: TSH: 1.053 u[IU]/mL (ref 0.350–4.500)

## 2018-11-16 LAB — ABO/RH: ABO/RH(D): O POS

## 2018-11-16 LAB — PROTIME-INR
INR: 1.06
PROTHROMBIN TIME: 13.7 s (ref 11.4–15.2)

## 2018-11-16 LAB — RPR: RPR Ser Ql: NONREACTIVE

## 2018-11-16 LAB — HEMOGLOBIN AND HEMATOCRIT, BLOOD
HCT: 24.9 % — ABNORMAL LOW (ref 36.0–46.0)
Hemoglobin: 8.3 g/dL — ABNORMAL LOW (ref 12.0–15.0)

## 2018-11-16 LAB — HEMOGLOBIN A1C
Hgb A1c MFr Bld: 5.7 % — ABNORMAL HIGH (ref 4.8–5.6)
Mean Plasma Glucose: 116.89 mg/dL

## 2018-11-16 LAB — APTT: aPTT: 31 seconds (ref 24–36)

## 2018-11-16 LAB — FIBRINOGEN: Fibrinogen: 208 mg/dL — ABNORMAL LOW (ref 210–475)

## 2018-11-16 MED ORDER — FENTANYL CITRATE (PF) 100 MCG/2ML IJ SOLN
100.0000 ug | Freq: Once | INTRAMUSCULAR | Status: DC
  Administered 2018-11-16: 100 ug via INTRAVENOUS

## 2018-11-16 MED ORDER — OXYTOCIN 40 UNITS IN NORMAL SALINE INFUSION - SIMPLE MED
2.5000 [IU]/h | INTRAVENOUS | Status: DC
  Administered 2018-11-16: 2.5 [IU]/h via INTRAVENOUS

## 2018-11-16 MED ORDER — ONDANSETRON HCL 4 MG PO TABS: 4.0000 mg | ORAL_TABLET | ORAL | Status: DC | PRN

## 2018-11-16 MED ORDER — FENTANYL 2.5 MCG/ML BUPIVACAINE 1/10 % EPIDURAL INFUSION (WH - ANES)
INTRAMUSCULAR | Status: DC | PRN
  Administered 2018-11-16: 14 mL/h via EPIDURAL

## 2018-11-16 MED ORDER — IBUPROFEN 600 MG PO TABS
600.0000 mg | ORAL_TABLET | Freq: Four times a day (QID) | ORAL | Status: DC
  Administered 2018-11-17 (×2): 600 mg via ORAL
  Filled 2018-11-16 (×2): qty 1

## 2018-11-16 MED ORDER — FLEET ENEMA 7-19 GM/118ML RE ENEM: 1.0000 | ENEMA | RECTAL | Status: DC | PRN

## 2018-11-16 MED ORDER — FENTANYL CITRATE (PF) 100 MCG/2ML IJ SOLN
INTRAMUSCULAR | Status: AC
  Administered 2018-11-16: 100 ug via INTRAVENOUS
  Filled 2018-11-16: qty 2

## 2018-11-16 MED ORDER — LACTATED RINGERS IV BOLUS
1000.0000 mL | Freq: Once | INTRAVENOUS | Status: AC
  Administered 2018-11-16: 1000 mL via INTRAVENOUS

## 2018-11-16 MED ORDER — OXYTOCIN BOLUS FROM INFUSION
500.0000 mL | Freq: Once | INTRAVENOUS | Status: AC
  Administered 2018-11-16: 500 mL via INTRAVENOUS

## 2018-11-16 MED ORDER — LIDOCAINE HCL (PF) 1 % IJ SOLN
30.0000 mL | INTRAMUSCULAR | Status: DC | PRN
  Filled 2018-11-16: qty 30

## 2018-11-16 MED ORDER — LIDOCAINE-EPINEPHRINE (PF) 2 %-1:200000 IJ SOLN
INTRAMUSCULAR | Status: DC | PRN
  Administered 2018-11-16: 3 mL via EPIDURAL

## 2018-11-16 MED ORDER — METHYLERGONOVINE MALEATE 0.2 MG/ML IJ SOLN
0.2000 mg | Freq: Once | INTRAMUSCULAR | Status: AC
  Administered 2018-11-16: 0.2 mg via INTRAMUSCULAR

## 2018-11-16 MED ORDER — ONDANSETRON HCL 4 MG/2ML IJ SOLN
4.0000 mg | Freq: Once | INTRAMUSCULAR | Status: AC
  Administered 2018-11-16: 4 mg via INTRAVENOUS
  Filled 2018-11-16: qty 2

## 2018-11-16 MED ORDER — COCONUT OIL OIL: 1.0000 "application " | TOPICAL_OIL | Status: DC | PRN

## 2018-11-16 MED ORDER — SENNOSIDES-DOCUSATE SODIUM 8.6-50 MG PO TABS
2.0000 | ORAL_TABLET | ORAL | Status: DC
  Filled 2018-11-16: qty 2

## 2018-11-16 MED ORDER — DIBUCAINE 1 % RE OINT: 1.0000 "application " | TOPICAL_OINTMENT | RECTAL | Status: DC | PRN

## 2018-11-16 MED ORDER — ACETAMINOPHEN 325 MG PO TABS
650.0000 mg | ORAL_TABLET | ORAL | Status: DC | PRN
  Administered 2018-11-16 – 2018-11-17 (×2): 650 mg via ORAL
  Filled 2018-11-16 (×2): qty 2

## 2018-11-16 MED ORDER — LACTATED RINGERS IV SOLN
INTRAVENOUS | Status: DC
  Administered 2018-11-16: 05:00:00 via INTRAVENOUS

## 2018-11-16 MED ORDER — METHYLERGONOVINE MALEATE 0.2 MG/ML IJ SOLN
INTRAMUSCULAR | Status: AC
  Filled 2018-11-16: qty 1

## 2018-11-16 MED ORDER — WITCH HAZEL-GLYCERIN EX PADS: 1.0000 "application " | MEDICATED_PAD | CUTANEOUS | Status: DC | PRN

## 2018-11-16 MED ORDER — MEASLES, MUMPS & RUBELLA VAC IJ SOLR: 0.5000 mL | Freq: Once | INTRAMUSCULAR | Status: DC

## 2018-11-16 MED ORDER — SODIUM CHLORIDE 0.9% IV SOLUTION
Freq: Once | INTRAVENOUS | Status: AC
  Administered 2018-11-16: 09:00:00 via INTRAVENOUS

## 2018-11-16 MED ORDER — ZOLPIDEM TARTRATE 5 MG PO TABS
5.0000 mg | ORAL_TABLET | Freq: Every evening | ORAL | Status: DC | PRN
  Administered 2018-11-16 – 2018-11-17 (×2): 5 mg via ORAL
  Filled 2018-11-16 (×2): qty 1

## 2018-11-16 MED ORDER — FENTANYL CITRATE (PF) 100 MCG/2ML IJ SOLN: 100.0000 ug | INTRAMUSCULAR | Status: DC | PRN

## 2018-11-16 MED ORDER — PRENATAL MULTIVITAMIN CH
1.0000 | ORAL_TABLET | Freq: Every day | ORAL | Status: DC
  Administered 2018-11-17: 1 via ORAL
  Filled 2018-11-16: qty 1

## 2018-11-16 MED ORDER — DIPHENHYDRAMINE HCL 25 MG PO CAPS: 25.0000 mg | ORAL_CAPSULE | Freq: Four times a day (QID) | ORAL | Status: DC | PRN

## 2018-11-16 MED ORDER — SOD CITRATE-CITRIC ACID 500-334 MG/5ML PO SOLN: 30.0000 mL | ORAL | Status: DC | PRN

## 2018-11-16 MED ORDER — OXYTOCIN 40 UNITS IN NORMAL SALINE INFUSION - SIMPLE MED
1.0000 m[IU]/min | INTRAVENOUS | Status: DC
  Administered 2018-11-16: 1 m[IU]/min via INTRAVENOUS

## 2018-11-16 MED ORDER — FENTANYL 2.5 MCG/ML BUPIVACAINE 1/10 % EPIDURAL INFUSION (WH - ANES)
INTRAMUSCULAR | Status: AC
  Filled 2018-11-16: qty 100

## 2018-11-16 MED ORDER — BENZOCAINE-MENTHOL 20-0.5 % EX AERO: 1.0000 "application " | INHALATION_SPRAY | CUTANEOUS | Status: DC | PRN

## 2018-11-16 MED ORDER — PHENYLEPHRINE 40 MCG/ML (10ML) SYRINGE FOR IV PUSH (FOR BLOOD PRESSURE SUPPORT)
PREFILLED_SYRINGE | INTRAVENOUS | Status: AC
  Filled 2018-11-16: qty 10

## 2018-11-16 MED ORDER — ONDANSETRON HCL 4 MG/2ML IJ SOLN: 4.0000 mg | Freq: Four times a day (QID) | INTRAMUSCULAR | Status: DC | PRN

## 2018-11-16 MED ORDER — ONDANSETRON HCL 4 MG/2ML IJ SOLN: 4.0000 mg | INTRAMUSCULAR | Status: DC | PRN

## 2018-11-16 MED ORDER — TETANUS-DIPHTH-ACELL PERTUSSIS 5-2.5-18.5 LF-MCG/0.5 IM SUSP: 0.5000 mL | Freq: Once | INTRAMUSCULAR | Status: DC

## 2018-11-16 MED ORDER — NIFEDIPINE 10 MG PO CAPS
10.0000 mg | ORAL_CAPSULE | Freq: Once | ORAL | Status: AC
  Administered 2018-11-16: 10 mg via ORAL
  Filled 2018-11-16: qty 1

## 2018-11-16 MED ORDER — SIMETHICONE 80 MG PO CHEW: 80.0000 mg | CHEWABLE_TABLET | ORAL | Status: DC | PRN

## 2018-11-16 MED ORDER — LACTATED RINGERS IV SOLN: 500.0000 mL | INTRAVENOUS | Status: DC | PRN

## 2018-11-16 MED ORDER — LORAZEPAM 1 MG PO TABS
0.5000 mg | ORAL_TABLET | Freq: Four times a day (QID) | ORAL | Status: DC | PRN
  Administered 2018-11-16 – 2018-11-18 (×2): 0.5 mg via ORAL
  Filled 2018-11-16 (×2): qty 1

## 2018-11-16 MED ORDER — SODIUM CHLORIDE 0.9% IV SOLUTION
Freq: Once | INTRAVENOUS | Status: AC
  Administered 2018-11-16: 08:00:00 via INTRAVENOUS

## 2018-11-16 NOTE — Progress Notes (Signed)
RROB ALSO USING DOPPLER TO FIND FHR, AT BEDSIDE IN MAU  WHILE RROB ON [facility] WITH PT, UNABLE TO GET A CONSISTENT FHR/UNABLE TO DETERMINE FHR....Marland KitchenMarland Kitchen

## 2018-11-16 NOTE — Anesthesia Procedure Notes (Signed)
Epidural Patient location during procedure: OB Start time: 11/16/2018 3:50 AM End time: 11/16/2018 4:05 AM  Staffing Anesthesiologist: Elmer Picker, MD Performed: anesthesiologist   Preanesthetic Checklist Completed: patient identified, pre-op evaluation, timeout performed, IV checked, risks and benefits discussed and monitors and equipment checked  Epidural Patient position: sitting Prep: site prepped and draped and DuraPrep Patient monitoring: continuous pulse ox, blood pressure, heart rate and cardiac monitor Approach: midline Location: L3-L4 Injection technique: LOR air  Needle:  Needle type: Tuohy  Needle gauge: 17 G Needle length: 9 cm Needle insertion depth: 4 cm Catheter type: closed end flexible Catheter size: 19 Gauge Catheter at skin depth: 10 cm Test dose: negative  Assessment Sensory level: T8 Events: blood not aspirated, injection not painful, no injection resistance, negative IV test and no paresthesia  Additional Notes Patient identified. Risks/Benefits/Options discussed with patient including but not limited to bleeding, infection, nerve damage, paralysis, failed block, incomplete pain control, headache, blood pressure changes, nausea, vomiting, reactions to medication both or allergic, itching and postpartum back pain. Confirmed with bedside nurse the patient's most recent platelet count. Confirmed with patient that they are not currently taking any anticoagulation, have any bleeding history or any family history of bleeding disorders. Patient expressed understanding and wished to proceed. All questions were answered. Sterile technique was used throughout the entire procedure. Please see nursing notes for vital signs. Test dose was given through epidural catheter and negative prior to continuing to dose epidural or start infusion. Warning signs of high block given to the patient including shortness of breath, tingling/numbness in hands, complete motor block, or  any concerning symptoms with instructions to call for help. Patient was given instructions on fall risk and not to get out of bed. All questions and concerns addressed with instructions to call with any issues or inadequate analgesia.  Reason for block:procedure for pain

## 2018-11-16 NOTE — Progress Notes (Signed)
DR WARD-ED PROVIDER, NOTIFIED DR Macon Large OF Josem Kaufmann EXAM FINDINGS

## 2018-11-16 NOTE — H&P (Addendum)
Obstetric History and Physical  Theresa Ochoa is a 33 y.o. [id number] with IUP at [redacted]w[redacted]d who presented to [facility] ER earlier with abdominal pain and contractions. Gets care with Dr. Shawnie Pons at [facility], history of three term cesarean sections, no placental previa or other abnormalities,  no other significant obstetric or medical history.  Initial evaluation showed cervical exam of FT/70/-3 with reported FHR in 120s by doppler. She initially had no bleeding, no leaking of fluid and patient reported fetal movement.   Her pain worsened while being evaluated in the [facility] ER and she was noted to be bleeding.  The FHR was reported to be stable, speculum exam was difficult given amount of blood seen but patient appeared hemodynamically stable. She was then urgently brought here via [facility] for further evaluation.  On arrival here at [facility], she was urgently taken to a room and FHR could not be auscultated with the doppler.  Urgent bedside formal scan revealed no fetal heartbeat, fetal cephalic presentation with posterior placenta; speculum exam done at the same time revealed big clots in vagina with bloody fluid noted. Low trickle of blood noted from os, cervix noted to be 1/70/-3, possible clot palpated in lower uterine segment.  Patient was told of about the fetal demise and placental abruption, she is appropriately sad. Of note, she denied any IVDA, cocaine use or other illicit substance use.  Patient currently reports having significant lower abdominal pain, ordered for Fentanyl.    Past Medical History:  Diagnosis Date  . Anxiety     Past Surgical History:  Procedure Laterality Date  . CESAREAN SECTION     x 3    OB History  Gravida Para Term Preterm AB Living  5 3 3   1 3   SAB TAB Ectopic Multiple Live Births               # Outcome Date GA Lbr Len/2nd Weight Sex Delivery Anes PTL Lv  5 Current           4 AB           3 Term           2 Term           1 Term             Social History   Socioeconomic  History  . Marital status: Single    Spouse name: Not on file  . Number of children: Not on file  . Years of education: Not on file  . Highest education level: Not on file  Occupational History  . Not on file  Social Needs  . Financial resource strain: Not on file  . Food insecurity:    Worry: Not on file    Inability: Not on file  . Transportation needs:    Medical: Not on file    Non-medical: Not on file  Tobacco Use  . Smoking status: Former Games developer  . Smokeless tobacco: Never Used  Substance and Sexual Activity  . Alcohol use: Yes    Comment: occ  . Drug use: Not Currently    Types: Marijuana  . Sexual activity: Yes    Comment: Pregnant  Lifestyle  . Physical activity:    Days per week: Not on file    Minutes per session: Not on file  . Stress: Not on file  Relationships  . Social connections:    Talks on phone: Not on file    Gets together: Not on  file    Attends religious service: Not on file    Active member of club or organization: Not on file    Attends meetings of clubs or organizations: Not on file    Relationship status: Not on file  Other Topics Concern  . Not on file  Social History Narrative  . Not on file    History reviewed. No pertinent family history.  No medications prior to admission.    No Known Allergies  Review of Systems: Negative except for what is mentioned in HPI.  Physical Exam: BP 129/74   Pulse 93   Temp 98.2 F (36.8 C) (Oral)   Resp (!) 27   LMP 06/09/2018   SpO2 100%  CONSTITUTIONAL: Well-developed, well-nourished female in with some pain.  HENT:  Normocephalic, atraumatic, External right and left ear normal. Oropharynx is clear and moist EYES: Conjunctivae and EOM are normal. Pupils are equal, round, and reactive to light. No scleral icterus.  NECK: Normal range of motion, supple, no masses SKIN: Skin is warm and dry. No rash noted. Not diaphoretic. No erythema. No pallor. NEUROLOGIC: Alert and oriented to person,  place, and time. Normal reflexes, muscle tone coordination. No cranial nerve deficit noted. PSYCHIATRIC: Sad mood and affect. Normal behavior. Normal judgment and thought content. CARDIOVASCULAR: Normal heart rate noted, regular rhythm RESPIRATORY: Effort and breath sounds normal, no problems with respiration noted ABDOMEN: Soft, moderate fundal tenderness, nondistended, gravid. MUSCULOSKELETAL: Normal range of motion. No edema and no tenderness. 2+ distal pulses.  Cervical Exam: As described above in HPI; 1/70/-3, slow trickle of blood Presentation: Cephalic on ultrasound Contractions: Every 2-3 mins   Pertinent Labs/Studies:   Results for orders placed or performed during the hospital encounter of 11/15/18 (from the past 24 hour(s))  CBC     Status: Abnormal   Collection Time: 11/16/18  1:02 AM  Result Value Ref Range   WBC 8.2 4.0 - 10.5 K/uL   RBC 3.41 (L) 3.87 - 5.11 MIL/uL   Hemoglobin 9.0 (L) 12.0 - 15.0 g/dL   HCT 40.929.3 (L) 81.136.0 - 91.446.0 %   MCV 85.9 80.0 - 100.0 fL   MCH 26.4 26.0 - 34.0 pg   MCHC 30.7 30.0 - 36.0 g/dL   RDW 78.214.6 95.611.5 - 21.315.5 %   Platelets 155 150 - 400 K/uL   nRBC 0.0 0.0 - 0.2 %  Wet prep, genital     Status: Abnormal   Collection Time: 11/16/18  1:48 AM  Result Value Ref Range   Yeast Wet Prep HPF POC NONE SEEN NONE SEEN   Trich, Wet Prep NONE SEEN NONE SEEN   Clue Cells Wet Prep HPF POC NONE SEEN NONE SEEN   WBC, Wet Prep HPF POC FEW (A) NONE SEEN   Sperm NONE SEEN   CBC     Status: Abnormal   Collection Time: 11/16/18  2:10 AM  Result Value Ref Range   WBC 10.4 4.0 - 10.5 K/uL   RBC 2.94 (L) 3.87 - 5.11 MIL/uL   Hemoglobin 8.0 (L) 12.0 - 15.0 g/dL   HCT 08.625.5 (L) 57.836.0 - 46.946.0 %   MCV 86.7 80.0 - 100.0 fL   MCH 27.2 26.0 - 34.0 pg   MCHC 31.4 30.0 - 36.0 g/dL   RDW 62.914.9 52.811.5 - 41.315.5 %   Platelets 127 (L) 150 - 400 K/uL   nRBC 0.0 0.0 - 0.2 %  Type and screen     Status: None (Preliminary result)   Collection Time:  11/16/18  2:10 AM  Result Value  Ref Range   ABO/RH(D) O POS    Antibody Screen PENDING    Sample Expiration      11/19/2018 Performed at [facility], [address] [place], [place] [address]     Assessment : Theresa Ochoa is a 33 y.o. G2P0 at [redacted]w[redacted]d being admitted for induction of labor in the setting of fetal demise and placental abruption (no active hemorrhage).  Plan: Will admit to L&D and start with foley bulb cervical ripening then pitocin per protocol.  If she starts to hemorrhage or become unstable, may need to proceed with repeat cesarean section.  She is type and crossmatched for 3 units of blood, has increased risk of hemorrhage and increased risk of needing surgery. OR team is aware.   Will keep NPO. Ordered fetal demise evaluation labs; will follow up results and manage accordingly. Patient declines chaplain services for now. Appropriate support to be given to patient and family. Continue close observation.    Jaynie Collins, MD, FACOG Obstetrician & Gynecologist, [facility] for [employer], [facility]

## 2018-11-16 NOTE — ED Notes (Signed)
[facility] called to transport to [facility]

## 2018-11-16 NOTE — Progress Notes (Addendum)
Faculty Note  Patient feeling okay, states she is having some cramping but minimal pain. Feels very "hurt" emotionally, but otherwise, physically, feeling well.   BP 121/73   Pulse 75   Temp 99.1 F (37.3 C) (Oral)   Resp 18   Ht 5\' 7"  (1.702 m)   LMP 06/09/2018   SpO2 99%   BMI 25.90 kg/m  Gen: alert, oriented, tearful Abd: soft, fundus firm at umbilicus GU: minimal bleeding  A/P: 48 [id number]. PPD#0 s/p VBAC for IUFD, abruption complicated by DIC. She has received 2 units pRBCs, 2 pools cryoprecipitate, 1 unit FFP. Her bleeding is minimal at this point and repeat labs stable. I reviewed her course with her and potential need for more blood products, she verbalizes understanding and is in agreement with plan.   Transfer to [facility] Pain meds prn Repeat labs for 1400, 2200   K. Therese Sarah, M.D. Attending Center for [employer] Midwife)

## 2018-11-16 NOTE — MAU Note (Signed)
Pt arrives via [facility] from [facility] with vaginal bleeding and abdominal pain. Dr. Macon Large at bedside and pelvic exam performed noting large amount of blood clots. Ultrasound at bedside, no fetal heart tones noted. IVFs started.

## 2018-11-16 NOTE — MAU Note (Signed)
[facility]. SSE by Dr. Macon LargeAnyanwu. Several large clots removed. Attempted FHR with doppler unable to to hear. U/S at bedside no HR visualized.

## 2018-11-16 NOTE — Progress Notes (Signed)
This note also relates to the following rows which could not be included: Resp - Cannot attach notes to unvalidated device data  RROB NOTIFIED DR EHUDJSH THAT HAVING VAGINAL BLEEDING; DO NOT CHECK CERVIX, NEED ED PROVIDER TO PERFORM SPECULUM EXAM; EXPEDITING TRANSFER

## 2018-11-16 NOTE — Progress Notes (Signed)
This note also relates to the following rows which could not be included: Resp - Cannot attach notes to unvalidated device data  RROB GAVE REPORT TO KATHY WILSON, RN-MAU......Marland Kitchen RROB WILL RIDE IN [facility] WITH PT TO DOPPLER FHR

## 2018-11-16 NOTE — Progress Notes (Signed)
This note also relates to the following rows which could not be included: Resp - Cannot attach notes to unvalidated device data  ED PROVIDER AND OB ATTENDING DISCUSSED PLAN OF CARE AND NEED FOR SPECULUM

## 2018-11-16 NOTE — Progress Notes (Signed)
This note also relates to the following rows which could not be included: Resp - Cannot attach notes to unvalidated device data  RROB NOTIFIED DR Macon Large OF PT HAVING INCREASED PAIN, TOLD OF CONCERNS FOR POSSIBLE ABRUPTION OR PREVIOUS C/S INCISION-OPENING? ASKED IF PT CAN COME TO [facility] AND IF PT CAN HAVE PAIN MEDS; PAIN MED ORDER RECEIVED FOR TRAMADOL, RN MAY CHECK CERVIX, AND NEED U/S TO LOOK AT CERVICAL LENGTH, SO PT WILL NEED TO COME TO [facility]OM[facility] IF CANNOT BE DONE AT [facility].Marland KitchenMarland Kitchen

## 2018-11-16 NOTE — Progress Notes (Signed)
This note also relates to the following rows which could not be included: Resp - Cannot attach notes to unvalidated device data  AT 0133 CARELINK ARRIVED; ED PROVIDER HAS NOT PERFORMED SPECULUM EXAM; RROB CALLED OB ATTENDING AND ASKED IF PT COULD BE TRANSFERRED WITHOUT SPECULUM EXAM; OB SAID PT CAN COME WITHOUT SPECULUM EXAM

## 2018-11-16 NOTE — Progress Notes (Signed)
Epidural placed, patient is comfortable. Cervical foley bulb inserted, cervix is 2/80/high (exam and foley placement done by Dr. Rhett Bannister). Bloody fluid noted in vagina, no clots. Continue close monitoring Will start pitocin after foley bulb ripening.      Jaynie Collins, MD, FACOG Obstetrician & Gynecologist, [facility] for [employer], [facility]

## 2018-11-16 NOTE — MAU Note (Signed)
Pt transported to L&D. En route, pt reported a gush of blood. A large amount of blood was noted on her pad. MD made aware of bleeding

## 2018-11-16 NOTE — Progress Notes (Signed)
RROB NOTIFIED ED PROVIDER THAT PT CAN TRANSFER WITHOUT SPECULUM EXAM PER OB ATTENDING.....  ED PROVIDER INSISTED ON DOING SPECULUM EXAM BEFORE TRANSFER

## 2018-11-16 NOTE — Progress Notes (Addendum)
OB/GYN Faculty Practice: Labor Progress Note  Subjective: Strip note. Plan of care discussed with RN. Recently tugged on FB, still tightly in place. Bleeding essentially unchanged. Comfortable with epidural.  Objective: BP 118/69   Pulse 74   Temp 98.2 F (36.8 C) (Oral)   Resp 16   Ht 5\' 7"  (1.702 m)   LMP 06/09/2018   SpO2 97%   BMI 25.90 kg/m  Gen: strip note Dilation: 1.5 Effacement (%): 70 Cervical Position: Posterior Station: -3 Presentation: Undeterminable Exam by:: Dr. Marlis Edelson  Assessment and Plan: 33 y.o. [id number] [redacted]w[redacted]d here with IUFD in setting of presumed placenta abruption and now developing DIC.  IUFD: FB placed, will defer starting pitocin until FB out.  -- pain control: epidural in place -- PPH Risk: high  DIC  Presumed Placental Abruption: Platelets down-trending, fibrinogen 92, coags prolonged. -- will give cryo 2 pools -- pRBC, FFP type and crossed -- repeat DIC, CMP, H/H at 0700   Laurel S. Earlene Plater, DO OB/GYN Fellow, Faculty Practice      Attestation of Attending Supervision of Obstetric Fellow: Evaluation and management procedures were performed by the Obstetric Fellow under my supervision and collaboration.  I have reviewed the Obstetric Fellow's note and chart, and I agree with the management and plan.   I checked on patient about 20 minutes after Dr. Earlene Plater.  On cervical exam, foley noted to be extruded into upper vagina; now 5/70/-3/cephalic with BBOW.  AROM for blood tinged fluid, fetal head palpated towards maternal right, not applied.  Minimal active bleeding noted. There is enough room tor delivery of infant at this dilatation.  Will start pitocin augmentation, and hope for vaginal delivery.  Patient is appropriately grieving, still declines chaplain services.  Will get Cryo soon, labs to be checked soon. Plan to give blood products presumptively and also as needed given DIC.    Jaynie Collins, MD, FACOG Attending Obstetrician &  Gynecologist Faculty Practice, [facility]

## 2018-11-16 NOTE — Progress Notes (Signed)
RROB TO ED FOR PT WHO IS 25 WEEKS WITH COMPLAINTS OF CONTRACTIONS; PT REPORTS NO BLEEDING OR LEAKING OF FLUID; NO PROBLEMS WITH THIS OR ANY OF HER PREGNANCIES; NO PROBLEMS/ISSUES WITH HER PLACENTA; HX 3 C/S'S

## 2018-11-16 NOTE — Anesthesia Preprocedure Evaluation (Signed)
Anesthesia Evaluation  Patient identified by MRN, date of birth, ID band Patient awake    Reviewed: Allergy & Precautions, NPO status , Patient's Chart, lab work & pertinent test results  Airway Mallampati: II  TM Distance: >3 FB Neck ROM: Full    Dental no notable dental hx.    Pulmonary neg pulmonary ROS, former smoker,    Pulmonary exam normal breath sounds clear to auscultation       Cardiovascular negative cardio ROS Normal cardiovascular exam Rhythm:Regular Rate:Normal     Neuro/Psych negative neurological ROS  negative psych ROS   GI/Hepatic negative GI ROS, Neg liver ROS,   Endo/Other  negative endocrine ROS  Renal/GU negative Renal ROS  negative genitourinary   Musculoskeletal negative musculoskeletal ROS (+)   Abdominal   Peds  Hematology  (+) Blood dyscrasia (Hgb 8), anemia ,   Anesthesia Other Findings IUFD in setting of placental abruption. Pt with 3 prior C/S. Actively bleeding but hemodynamically stable.  Reproductive/Obstetrics negative OB ROS                             Anesthesia Physical Anesthesia Plan  ASA: III  Anesthesia Plan: Epidural   Post-op Pain Management:    Induction:   PONV Risk Score and Plan: Treatment may vary due to age or medical condition  Airway Management Planned: Natural Airway  Additional Equipment:   Intra-op Plan:   Post-operative Plan:   Informed Consent: I have reviewed the patients History and Physical, chart, labs and discussed the procedure including the risks, benefits and alternatives for the proposed anesthesia with the patient or authorized representative who has indicated his/her understanding and acceptance.       Plan Discussed with: Anesthesiologist  Anesthesia Plan Comments: (Patient identified. Risks, benefits, options discussed with patient including but not limited to bleeding, infection, nerve damage,  paralysis, failed block, incomplete pain control, headache, blood pressure changes, nausea, vomiting, reactions to medication, itching, and post partum back pain. Confirmed with bedside nurse the patient's most recent platelet count. Confirmed with the patient that they are not taking any anticoagulation, have any bleeding history or any family history of bleeding disorders. Patient expressed understanding and wishes to proceed. All questions were answered. )        Anesthesia Quick Evaluation

## 2018-11-16 NOTE — Progress Notes (Signed)
Patient noted to have bloody fluid, clots in vagina. Cervix was 2 cm dilated. Will get epidural soon and evaluate for foley bulb placement. Continue NPO.  Jaynie Collins, MD, FACOG Obstetrician & Gynecologist, [facility] for [employer], [facility]

## 2018-11-17 LAB — CBC
HCT: 22.7 % — ABNORMAL LOW (ref 36.0–46.0)
Hemoglobin: 7.4 g/dL — ABNORMAL LOW (ref 12.0–15.0)
MCH: 27 pg (ref 26.0–34.0)
MCHC: 32.6 g/dL (ref 30.0–36.0)
MCV: 82.8 fL (ref 80.0–100.0)
Platelets: 89 10*3/uL — ABNORMAL LOW (ref 150–400)
RBC: 2.74 MIL/uL — ABNORMAL LOW (ref 3.87–5.11)
RDW: 15 % (ref 11.5–15.5)
WBC: 7.9 10*3/uL (ref 4.0–10.5)
nRBC: 0 % (ref 0.0–0.2)

## 2018-11-17 LAB — FIBRINOGEN: Fibrinogen: 253 mg/dL (ref 210–475)

## 2018-11-17 LAB — COMPREHENSIVE METABOLIC PANEL
ALT: 22 U/L (ref 0–44)
AST: 23 U/L (ref 15–41)
Albumin: 2.5 g/dL — ABNORMAL LOW (ref 3.5–5.0)
Alkaline Phosphatase: 61 U/L (ref 38–126)
Anion gap: 4 — ABNORMAL LOW (ref 5–15)
BUN: 7 mg/dL (ref 6–20)
CO2: 23 mmol/L (ref 22–32)
Calcium: 7.8 mg/dL — ABNORMAL LOW (ref 8.9–10.3)
Chloride: 108 mmol/L (ref 98–111)
Creatinine, Ser: 0.74 mg/dL (ref 0.44–1.00)
GFR calc non Af Amer: >60 mL/min (ref >60)
Glucose, Bld: 81 mg/dL (ref 70–99)
Potassium: 3.3 mmol/L — ABNORMAL LOW (ref 3.5–5.1)
Sodium: 135 mmol/L (ref 135–145)
Total Bilirubin: 0.3 mg/dL (ref 0.3–1.2)
Total Protein: 5.4 g/dL — ABNORMAL LOW (ref 6.5–8.1)

## 2018-11-17 LAB — PREPARE CRYOPRECIPITATE
Unit division: 0
Unit division: 0

## 2018-11-17 LAB — BPAM CRYOPRECIPITATE
BLOOD PRODUCT EXPIRATION DATE: 202002081301
Blood Product Expiration Date: 202002081347
ISSUE DATE / TIME: 202002080802
ISSUE DATE / TIME: 202002080717
UNIT TYPE AND RH: 5100
Unit Type and Rh: 5100

## 2018-11-17 LAB — PROTIME-INR
INR: 1
PROTHROMBIN TIME: 13.1 s (ref 11.4–15.2)

## 2018-11-17 LAB — PLATELET COUNT: Platelets: 90 10*3/uL — ABNORMAL LOW (ref 150–400)

## 2018-11-17 MED ORDER — POTASSIUM CHLORIDE CRYS ER 20 MEQ PO TBCR
20.0000 meq | EXTENDED_RELEASE_TABLET | Freq: Two times a day (BID) | ORAL | Status: DC
  Administered 2018-11-17 – 2018-11-18 (×3): 20 meq via ORAL
  Filled 2018-11-17 (×3): qty 1

## 2018-11-17 NOTE — Anesthesia Postprocedure Evaluation (Signed)
Anesthesia Post Note  Patient: Theresa Ochoa  Procedure(s) Performed: AN AD HOC LABOR EPIDURAL     Patient location during evaluation: Mother Baby Anesthesia Type: Epidural Level of consciousness: awake and alert Pain management: pain level controlled Vital Signs Assessment: post-procedure vital signs reviewed and stable Respiratory status: spontaneous breathing, nonlabored ventilation and respiratory function stable Cardiovascular status: stable Postop Assessment: no headache, no backache, epidural receding and no apparent nausea or vomiting Anesthetic complications: no    Last Vitals:  Vitals:   11/17/18 0616 11/17/18 0737  BP: 98/72 105/69  Pulse: 88   Resp: 17 16  Temp: 36.7 C 36.7 C  SpO2: 100%     Last Pain:  Vitals:   11/17/18 0628  TempSrc:   PainSc: 8    Pain Goal: Patients Stated Pain Goal: 2 (11/17/18 5573)                 Rica Records

## 2018-11-17 NOTE — Lactation Note (Signed)
Lactation Consultation Note  Patient Name: Edith Largo Today's Date: 11/17/2018   Spoke with RN taking care of Marjan about [facility] visiting with Mom.  RN thought that Clatie was currently having a difficult time emotionally, but would inquire whether she would like a visit from Lactation.    RN given Lactation brochure, [employer], and Lactation after Loss brochure.  RN to call [facility] if Mom would like a consult.    Judee Clara 11/17/2018, 9:30 AM

## 2018-11-17 NOTE — Progress Notes (Signed)
Faculty Attending Note  Post Partum Day 1  Subjective: Patient is feeling better. She reports well controlled pain on PO pain meds. She is ambulating and denies light-headedness or dizziness. She is passing flatus. She is tolerating a regular diet without nausea/vomiting. Bleeding is minimal. She feels she is coping okay, chaplain has been to see her. Baby remains bedside.  Objective: Blood pressure 98/72, pulse 88, temperature 98 F (36.7 C), temperature source Oral, resp. rate 17, height '5\' 7"'  (1.702 m), weight 77.1 kg, last menstrual period 06/09/2018, SpO2 100 %, unknown if currently breastfeeding. Temp:  [97.9 F (36.6 C)-99.4 F (37.4 C)] 98 F (36.7 C) (02/09 0616) Pulse Rate:  [71-129] 88 (02/09 0616) Resp:  [14-18] 17 (02/09 0616) BP: (98-135)/(60-99) 98/72 (02/09 0616) SpO2:  [96 %-100 %] 100 % (02/09 0616) Weight:  [77.1 kg] 77.1 kg (02/08 1330)  Physical Exam:  General: alert, oriented, cooperative Chest: normal respiratory effort Heart: RRR  Abdomen: soft, appropriately tender to palpation  Uterine Fundus: firm, 2 fingers below the umbilicus Lochia: moderate, rubra DVT Evaluation: no evidence of DVT Extremities: no edema, no calf tenderness   Current Facility-Administered Medications:  .  acetaminophen (TYLENOL) tablet 650 mg, 650 mg, Oral, Q4H PRN, Juleen China, Laurel S, DO, 650 mg at 11/17/18 4287 .  benzocaine-Menthol (DERMOPLAST) 20-0.5 % topical spray 1 application, 1 application, Topical, PRN, Wallace, Laurel S, DO .  coconut oil, 1 application, Topical, PRN, Juleen China, Laurel S, DO .  witch hazel-glycerin (TUCKS) pad 1 application, 1 application, Topical, PRN **AND** dibucaine (NUPERCAINAL) 1 % rectal ointment 1 application, 1 application, Rectal, PRN, Juleen China, Laurel S, DO .  diphenhydrAMINE (BENADRYL) capsule 25 mg, 25 mg, Oral, Q6H PRN, Juleen China, Laurel S, DO .  ibuprofen (ADVIL,MOTRIN) tablet 600 mg, 600 mg, Oral, Q6H, Wallace, Laurel S, DO .  LORazepam (ATIVAN)  tablet 0.5 mg, 0.5 mg, Oral, Q6H PRN, Sloan Leiter, MD, 0.5 mg at 11/16/18 1937 .  measles, mumps & rubella vaccine (MMR) injection 0.5 mL, 0.5 mL, Subcutaneous, Once, Wallace, Laurel S, DO .  ondansetron (ZOFRAN) tablet 4 mg, 4 mg, Oral, Q4H PRN **OR** ondansetron (ZOFRAN) injection 4 mg, 4 mg, Intravenous, Q4H PRN, Juleen China, Laurel S, DO .  prenatal multivitamin tablet 1 tablet, 1 tablet, Oral, Q1200, Wallace, Laurel S, DO .  senna-docusate (Senokot-S) tablet 2 tablet, 2 tablet, Oral, Q24H, Wallace, Laurel S, DO .  simethicone (MYLICON) chewable tablet 80 mg, 80 mg, Oral, PRN, Juleen China, Laurel S, DO .  Tdap (BOOSTRIX) injection 0.5 mL, 0.5 mL, Intramuscular, Once, Wallace, Laurel S, DO .  zolpidem (AMBIEN) tablet 5 mg, 5 mg, Oral, QHS PRN, Juleen China, Laurel S, DO, 5 mg at 11/16/18 2304 Recent Labs    11/16/18 2009 11/17/18 0619  HGB 7.6* 7.4*  HCT 23.2* 22.7*    Assessment/Plan: Social Work consult  Patient is 33 y.o. G8T1572 PPD#1 s/p VBAC at 75w1dwith IUFD, abruption, DIC. She is doing remarkably well, physically feeling better than last night. Mentally, feels she is "just there" but appears appropriate. Stable vital signs and labs stable today. Will plan to repeat labs tomorrow. Still with epidural in place, will plan to pull once PLT > 100.   Continue routine post partum care Pain meds prn Regular diet undecided for birth control Will repeat labs tomorrow am Plan for discharge possibly tomorrow    Theresa Ochoa M.D. Attending Center for [employer](Faculty Practice)  11/17/2018, 7:27 AM

## 2018-11-17 NOTE — Progress Notes (Signed)
CSW met with [name] in room 309 to provide resources for outpatient counseling. When CSW arrived, [name] was resting in bed with infant  (deceased) in her arms. CSW explained CSW's role and [name] was open to meeting with CSW.  CSW offered condolences and [name] was appreciative and shared her birthing experience.  [name] recognized feelings of being in shock and sad.  CSW normalized and validated [name]'s feelings and offered [name] resources for outpatient counseling. CSW assessed for safety and [name] denied SI and HI.  [name] communicated how kind staff have been and was pleased with the care that she received from the spiritual care department.   [name] had not additional questions or concerns.    There are not barriers to discharge.   Angel Boyd-Gilyard, MSW, LCSW Clinical Social Work [phone]  

## 2018-11-18 LAB — CBC
HCT: 22.4 % — ABNORMAL LOW (ref 36.0–46.0)
Hemoglobin: 7.2 g/dL — ABNORMAL LOW (ref 12.0–15.0)
MCH: 27.1 pg (ref 26.0–34.0)
MCHC: 32.1 g/dL (ref 30.0–36.0)
MCV: 84.2 fL (ref 80.0–100.0)
Platelets: 104 10*3/uL — ABNORMAL LOW (ref 150–400)
RBC: 2.66 MIL/uL — AB (ref 3.87–5.11)
RDW: 15.1 % (ref 11.5–15.5)
WBC: 7.2 10*3/uL (ref 4.0–10.5)
nRBC: 0 % (ref 0.0–0.2)

## 2018-11-18 LAB — COMPREHENSIVE METABOLIC PANEL
ALT: 18 U/L (ref 0–44)
AST: 16 U/L (ref 15–41)
Albumin: 2.4 g/dL — ABNORMAL LOW (ref 3.5–5.0)
Alkaline Phosphatase: 52 U/L (ref 38–126)
Anion gap: 4 — ABNORMAL LOW (ref 5–15)
BUN: 11 mg/dL (ref 6–20)
CO2: 22 mmol/L (ref 22–32)
Calcium: 7.7 mg/dL — ABNORMAL LOW (ref 8.9–10.3)
Chloride: 111 mmol/L (ref 98–111)
Creatinine, Ser: 0.64 mg/dL (ref 0.44–1.00)
GFR calc Af Amer: >60 mL/min (ref >60)
GFR calc non Af Amer: >60 mL/min (ref >60)
Glucose, Bld: 83 mg/dL (ref 70–99)
Potassium: 3.6 mmol/L (ref 3.5–5.1)
Sodium: 137 mmol/L (ref 135–145)
Total Bilirubin: 0.4 mg/dL (ref 0.3–1.2)
Total Protein: 5.1 g/dL — ABNORMAL LOW (ref 6.5–8.1)

## 2018-11-18 LAB — GC/CHLAMYDIA PROBE AMP (~~LOC~~) NOT AT ARMC
CHLAMYDIA, DNA PROBE: NEGATIVE
Neisseria Gonorrhea: NEGATIVE

## 2018-11-18 LAB — HIV ANTIBODY (ROUTINE TESTING W REFLEX): HIV Screen 4th Generation wRfx: NONREACTIVE

## 2018-11-18 MED ORDER — IBUPROFEN 600 MG PO TABS: 600.0000 mg | ORAL_TABLET | Freq: Four times a day (QID) | ORAL | 2 refills | Status: DC | PRN

## 2018-11-18 NOTE — Progress Notes (Signed)
Pt discharged with printed instructions. Pt verbalized an understanding. Tracen Mahler L Maleaha Hughett, RN 

## 2018-11-18 NOTE — Progress Notes (Signed)
Notified Dr Armond Hang of AM Platelet count of 104 and she gave ok to pull the patients epidural line.Patient tolerated well and site is CDI no drainage or discoloration.

## 2018-11-18 NOTE — Discharge Instructions (Signed)
Vaginal Delivery, Care After  This sheet gives you information about how to care for yourself after your delivery. Your health care provider may also give you more specific instructions. If you have problems or questions, contact your health care provider.    What can I expect after the procedure?  After delivery, it is common to have:   Soreness in your abdomen, your vagina, and the area between the opening of your vagina and your anus (perineum).   Tiredness (fatigue).   Cramps.   Breast tenderness related to engorgement.   Some bleeding and discharge from your vagina. This may continue for about 6 weeks. The bleeding and discharge will start out red, then become pink, then yellow, and finally white.   Tenderness in your vagina or perineum if you had an episiotomy or a vaginal tear. This may last several weeks.   Emotions that change quickly. Common emotions include:  ? Sadness.  ? Anger.  ? Denial.  ? Guilt.  Depression.  Follow these instructions at home:  Vaginal and perineal care   Keep your perineum clean and dry as told by your health care provider.   Wipe from front to back when you use the toilet.   If you have an episiotomy or a vaginal tear, check for signs of infection, such as:  ? Increasing redness, swelling, or pain in your perineal area.  ? Pus or bad-smelling discharge coming from your wound or vagina.   To relieve pain at the wound area or pain caused by hemorrhoids, try taking a warm sitz bath 2-3 times a day.   Do not use tampons or douche until your health care provider says it is okay.  Medicines   Take over-the-counter and prescription medicines only as told by your health care provider.   If you were prescribed an antibiotic medicine, take it as told by your health care provider. Do not stop taking the antibiotic even if you start to feel better.  Eating and drinking     Drink enough fluids to keep your urine pale yellow.   Eat high-fiber foods every day. These foods may help  prevent or relieve constipation. High-fiber foods include:  ? Whole grain cereals and breads.  ? Brown rice.  ? Beans.  ? Fresh fruits and vegetables.  Activity   If possible, have someone help you with your household activities for at least a few days after you leave the hospital.   Return to your normal activities as told by your health care provider. Ask your health care provider what activities are safe for you.   Rest as much as possible.   Talk with your health care provider about when you can engage in sexual activity. This may depend on your:  ? Risk of infection.  ? Rate of healing.  ? Comfort and desire to engage in sexual activity.  Emotional support   Consider seeking support for your loss. Some forms of support that you might consider include your religious leader, friends, family, a professional counselor, or a bereavement support group.  General instructions   Wear a supportive and well-fitting bra.   If you pass a blood clot, save it and call your health care provider to discuss. Do not flush blood clots down the toilet before you get instructions from your health care provider.   Keep all of your scheduled postpartum visits. At these visits, your health care provider will check to make sure that you are healing, both physically and   emotionally.  Contact a health care provider if:   You feel sad or depressed.   You are having trouble eating or sleeping.   You lose interest in activities you used to enjoy.   You pass a blood clot from your vagina.   You have pus or a bad-smelling discharge coming from your wound or vagina.   You have increasing redness, swelling, or pain in your perineal area.   You feel pain or burning when you urinate.   You urinate more often than normal.   You have a fever.   Your breasts become hard, red, or painful.   You are dizzy or light-headed.   You have a rash.   You feel nauseous or you vomit.   You have not had a menstrual period by the 12th week  after delivery.  Get help right away if:   You have persistent pain that is not relieved by comfort measures or medicines.   You have chest pain or trouble breathing.   You have blurred vision or you see spots.   You have a severe headache.   You faint.   You have sudden, severe leg pain.   You bleed from your vagina so much that you fill more than one sanitary pad in one hour. Bleeding should not be heavier than your heaviest period.   You have thoughts of hurting yourself.  If you ever feel like you may hurt yourself or others, or have thoughts about taking your own life, get help right away. You can go to your nearest emergency department or call:   Your local emergency services (911 in the [place]   A suicide crisis helpline, such as the National Suicide Prevention Lifeline at 1-800-273-8255. This is open 24 hours a day.  Summary   Take over-the-counter and prescription medicines only as told by your health care provider.   Return to your normal activities as told by your health care provider. Ask your health care provider what activities are safe for you.   Consider getting support for your loss. Sources of support include religious leaders, friends, family, professional counselors, and bereavement support groups.   Keep all follow-up visits as told by your health care provider. This is important.  This information is not intended to replace advice given to you by your health care provider. Make sure you discuss any questions you have with your health care provider.  Document Released: 02/09/2014 Document Revised: 07/09/2017 Document Reviewed: 01/04/2017  [employer]vier Interactive Patient Education  2019 [employer]

## 2018-11-18 NOTE — Discharge Summary (Signed)
Obstetric Physician Discharge Summary  Patient ID: Theresa Ochoa MRN: [id number] DOB/AGE: March 27, 1986 33 y.o.  Admit date: 11/15/2018 Discharge date: 11/18/2018  Admission and Discharge Diagnoses:  Principal Problem:   Fetal demise at 25 weeks, antepartum Active Problems:   Placental abruption, second trimester   Anemia of pregnancy in second trimester   DIC (disseminated intravascular coagulation) (HCC)  Discharged Condition: stable  Hospital Course: Patient was admitted with 25 week intrauterine fetal demise in the setting of placental abruption with associated hemorrhage.  Although she had three previous cesarean sections, she was relatively stable, and the decision was made to proceed with IOL with foley bulb, pitocin and AROM.  Patient agreed with this plan. During her induction, she was found to be in consumptive coagulalopathy and was given blood products. She was not actively hemorrhaging and there was no indication for urgent surgical intervention. Her IOL progressed quickly and she delivered vaginally; see delivery note for more details. She ending up receiving two pools of cryoprecipitate and 2 units of pRBCs. She had an uncomplicated postpartum course, received Chaplain services support and support from staff given her IUFD. On PPD#2, she was tolerating a regular diet, ambulating, having no bowel/urinary issues and pain was controlled. She was deemed stable for discharge to home.  A message was sent to [facility] Admin pool for patient to follow up in one week for mood check and 3 weeks for follow up visit.   Significant Diagnostic Studies:  Recent Results (from the past 2160 hour(s))  Urinalysis, Routine w reflex microscopic     Status: Abnormal   Collection Time: 11/15/18  1:39 AM  Result Value Ref Range   Color, Urine YELLOW YELLOW   APPearance HAZY (A) CLEAR   Specific Gravity, Urine 1.014 1.005 - 1.030   pH 6.0 5.0 - 8.0   Glucose, UA NEGATIVE NEGATIVE mg/dL   Hgb urine dipstick  NEGATIVE NEGATIVE   Bilirubin Urine NEGATIVE NEGATIVE   Ketones, ur NEGATIVE NEGATIVE mg/dL   Protein, ur NEGATIVE NEGATIVE mg/dL   Nitrite NEGATIVE NEGATIVE   Leukocytes, UA NEGATIVE NEGATIVE    Comment: Performed at [facility], [address], [place], [place] [address]  GC/Chlamydia probe amp ([place])not at [facility]     Status: None   Collection Time: 11/16/18 12:00 AM  Result Value Ref Range   Chlamydia Negative     Comment: Normal Reference Range - Negative   Neisseria gonorrhea Negative     Comment: Normal Reference Range - Negative  CBC     Status: Abnormal   Collection Time: 11/16/18  1:02 AM  Result Value Ref Range   WBC 8.2 4.0 - 10.5 K/uL   RBC 3.41 (L) 3.87 - 5.11 MIL/uL   Hemoglobin 9.0 (L) 12.0 - 15.0 g/dL   HCT 19.1 (L) 47.8 - 29.5 %   MCV 85.9 80.0 - 100.0 fL   MCH 26.4 26.0 - 34.0 pg   MCHC 30.7 30.0 - 36.0 g/dL   RDW 62.1 30.8 - 65.7 %   Platelets 155 150 - 400 K/uL   nRBC 0.0 0.0 - 0.2 %    Comment: Performed at [facility], [address] [place], [place] [address]  Wet prep, genital     Status: Abnormal   Collection Time: 11/16/18  1:48 AM  Result Value Ref Range   Yeast Wet Prep HPF POC NONE SEEN NONE SEEN   Trich, Wet Prep NONE SEEN NONE SEEN   Clue Cells Wet Prep HPF  POC NONE SEEN NONE SEEN   WBC, Wet Prep HPF POC FEW (A) NONE SEEN   Sperm NONE SEEN     Comment: Performed at [facility], [address] [place], [place] [address]  CBC     Status: Abnormal   Collection Time: 11/16/18  2:10 AM  Result Value Ref Range   WBC 10.4 4.0 - 10.5 K/uL   RBC 2.94 (L) 3.87 - 5.11 MIL/uL   Hemoglobin 8.0 (L) 12.0 - 15.0 g/dL   HCT 60.4 (L) 54.0 - 98.1 %   MCV 86.7 80.0 - 100.0 fL   MCH 27.2 26.0 - 34.0 pg   MCHC 31.4 30.0 - 36.0 g/dL   RDW 19.1 47.8 - 29.5 %   Platelets 127 (L) 150 - 400 K/uL   nRBC 0.0 0.0 - 0.2 %    Comment: Performed at [facility], [address], [place], [place] [address]  Comprehensive metabolic panel      Status: Abnormal   Collection Time: 11/16/18  2:10 AM  Result Value Ref Range   Sodium 134 (L) 135 - 145 mmol/L   Potassium 3.9 3.5 - 5.1 mmol/L   Chloride 111 98 - 111 mmol/L   CO2 16 (L) 22 - 32 mmol/L   Glucose, Bld 94 70 - 99 mg/dL   BUN 13 6 - 20 mg/dL   Creatinine, Ser 8.65 0.44 - 1.00 mg/dL   Calcium 8.1 (L) 8.9 - 10.3 mg/dL   Total Protein 5.6 (L) 6.5 - 8.1 g/dL   Albumin 2.7 (L) 3.5 - 5.0 g/dL   AST 27 15 - 41 U/L   ALT 26 0 - 44 U/L   Alkaline Phosphatase 57 38 - 126 U/L   Total Bilirubin 0.8 0.3 - 1.2 mg/dL   GFR calc non Af Amer >60 >60 mL/min   GFR calc Af Amer >60 >60 mL/min   Anion gap 7 5 - 15    Comment: Performed at [facility], [address], [place], [place] [address]  Type and screen     Status: None (Preliminary result)   Collection Time: 11/16/18  2:10 AM  Result Value Ref Range   ABO/RH(D) O POS    Antibody Screen NEG    Sample Expiration 11/19/2018    Unit Number [id number]    Blood Component Type RED CELLS,LR    Unit division 00    Status of Unit ISSUED,FINAL    Transfusion Status OK TO TRANSFUSE    Crossmatch Result Compatible    Unit Number [id number]    Blood Component Type RED CELLS,LR    Unit division 00    Status of Unit ALLOCATED    Transfusion Status OK TO TRANSFUSE    Crossmatch Result Compatible    Unit Number [id number]    Blood Component Type RED CELLS,LR    Unit division 00    Status of Unit ISSUED,FINAL    Transfusion Status OK TO TRANSFUSE    Crossmatch Result      Compatible Performed at [facility], [address], [place], [place] [address]   ABO/Rh     Status: None   Collection Time: 11/16/18  2:10 AM  Result Value Ref Range   ABO/RH(D)      O POS Performed at [facility], [address], [place], [place] [address]   BPAM RBC     Status: None (Preliminary result)   Collection Time: 11/16/18  2:10 AM  Result Value Ref Range   ISSUE DATE /  TIME 182993716967    Blood Product Unit Number  [id number]    PRODUCT CODE E5277O24    Unit Type and Rh 5100    Blood Product Expiration Date 235361443154    Blood Product Unit Number [id number]    Unit Type and Rh 5100    Blood Product Expiration Date 671245809983    ISSUE DATE / TIME 382505397673    Blood Product Unit Number [id number]    PRODUCT CODE [id number]    Unit Type and Rh 5100    Blood Product Expiration Date 683419622297   Prepare RBC     Status: None   Collection Time: 11/16/18  2:57 AM  Result Value Ref Range   Order Confirmation      ORDER PROCESSED BY BLOOD BANK Performed at [facility], [address], [place], [place] [address]   Hemoglobin A1c     Status: Abnormal   Collection Time: 11/16/18  3:45 AM  Result Value Ref Range   Hgb A1c MFr Bld 5.7 (H) 4.8 - 5.6 %    Comment: (NOTE) Pre diabetes:          5.7%-6.4% Diabetes:              >6.4% Glycemic control for   <7.0% adults with diabetes    Mean Plasma Glucose 116.89 mg/dL    Comment: Performed at [facility], [address], [place], [place] [address]  DIC (disseminated intravasc coag) panel     Status: Abnormal   Collection Time: 11/16/18  3:45 AM  Result Value Ref Range   Prothrombin Time 15.7 (H) 11.4 - 15.2 seconds   INR 1.26    aPTT 38 (H) 24 - 36 seconds    Comment:        IF BASELINE aPTT IS ELEVATED, SUGGEST PATIENT RISK ASSESSMENT BE USED TO DETERMINE APPROPRIATE ANTICOAGULANT THERAPY.    Fibrinogen 92 (LL) 210 - 475 mg/dL    Comment: REPEATED TO VERIFY CRITICAL RESULT CALLED TO, READ BACK BY AND VERIFIED WITH: BUNGARNER B AT 0455 ON 11/16/2018 BY SAINVILUS S SPECIMEN CHECKED FOR CLOTS    D-Dimer, Quant >20.00 (H) 0.00 - 0.50 ug/mL-FEU    Comment: (NOTE) At the manufacturer cut-off of 0.50 ug/mL FEU, this assay has been documented to exclude PE with a sensitivity and negative predictive value of 97 to 99%.  At this time, this assay has not been approved by the FDA to exclude DVT/VTE. Results should be  correlated with clinical presentation.    Platelets 108 (L) 150 - 400 K/uL    Comment: SPECIMEN CHECKED FOR CLOTS REPEATED TO VERIFY PLATELET COUNT CONFIRMED BY SMEAR    Smear Review NO SCHISTOCYTES SEEN     Comment: Performed at [facility], [address] [place], [place] [address]  RPR     Status: None   Collection Time: 11/16/18  3:45 AM  Result Value Ref Range   RPR Ser Ql Non Reactive Non Reactive    Comment: (NOTE) Performed At: [facility] [address] [place], [place] [id number] Jolene Schimke MD [id number]   TSH     Status: None   Collection Time: 11/16/18  3:45 AM  Result Value Ref Range   TSH 1.053 0.350 - 4.500 uIU/mL    Comment: Performed by a 3rd Generation assay with a functional sensitivity of <=0.01 uIU/mL. Performed at [facility], [address], [place], [place] [address]   HIV Antibody (routine testing w rflx)     Status:  None   Collection Time: 11/16/18  3:45 AM  Result Value Ref Range   HIV Screen 4th Generation wRfx Non Reactive Non Reactive    Comment: (NOTE) Performed At: [facility] [address] [place], [place] [id number] Jolene Schimke MD WU:[id number]   Rapid urine drug screen (hospital performed)     Status: Abnormal   Collection Time: 11/16/18  5:56 AM  Result Value Ref Range   Opiates NONE DETECTED NONE DETECTED   Cocaine NONE DETECTED NONE DETECTED   Benzodiazepines NONE DETECTED NONE DETECTED   Amphetamines NONE DETECTED NONE DETECTED   Tetrahydrocannabinol POSITIVE (A) NONE DETECTED   Barbiturates NONE DETECTED NONE DETECTED    Comment: (NOTE) DRUG SCREEN FOR MEDICAL PURPOSES ONLY.  IF CONFIRMATION IS NEEDED FOR ANY PURPOSE, NOTIFY LAB WITHIN 5 DAYS. LOWEST DETECTABLE LIMITS FOR URINE DRUG SCREEN Drug Class                     Cutoff (ng/mL) Amphetamine and metabolites    1000 Barbiturate and metabolites    200 Benzodiazepine                 200 Tricyclics and metabolites     300 Opiates  and metabolites        300 Cocaine and metabolites        300 THC                            50 Performed at [facility], [address], [place], [place] [address]   Prepare cryoprecipitate     Status: None   Collection Time: 11/16/18  6:06 AM  Result Value Ref Range   Unit Number [id number]    Blood Component Type CRYPOOL THAW    Unit division 00    Status of Unit ISSUED,FINAL    Transfusion Status OK TO TRANSFUSE    Unit Number [id number]    Blood Component Type CRYPOOL THAW    Unit division 00    Status of Unit ISSUED,FINAL    Transfusion Status      OK TO TRANSFUSE Performed at [facility], [address] [place], [place] [address]   BPAM Cryoprecipitate     Status: None   Collection Time: 11/16/18  6:06 AM  Result Value Ref Range   ISSUE DATE / TIME 644034742595    Blood Product Unit Number [id number]    PRODUCT CODE O8416S06    Unit Type and Rh 5100    Blood Product Expiration Date 301601093235    ISSUE DATE / TIME 573220254270    Blood Product Unit Number [id number]    PRODUCT CODE H6073X10    Unit Type and Rh 5100    Blood Product Expiration Date 626948546270   Prepare fresh frozen plasma     Status: None (Preliminary result)   Collection Time: 11/16/18  7:39 AM  Result Value Ref Range   Unit Number [id number]    Blood Component Type THW PLS APHR    Unit division A0    Status of Unit ALLOCATED    Transfusion Status OK TO TRANSFUSE    Unit Number [id number]    Blood Component Type THW PLS APHR    Unit division B0    Status of Unit ALLOCATED    Transfusion Status OK TO TRANSFUSE   DIC (disseminated intravasc coag) panel     Status: Abnormal   Collection Time: 11/16/18  7:39 AM  Result Value  Ref Range   Prothrombin Time 15.2 11.4 - 15.2 seconds   INR 1.21    aPTT 38 (H) 24 - 36 seconds    Comment:        IF BASELINE aPTT IS ELEVATED, SUGGEST PATIENT RISK ASSESSMENT BE USED TO DETERMINE APPROPRIATE ANTICOAGULANT THERAPY.     Fibrinogen 92 (LL) 210 - 475 mg/dL    Comment: REPEATED TO VERIFY CONSISTENT WITH PREVIOUS RESULT CRITICAL RESULT CALLED TO, READ BACK BY AND VERIFIED WITH: JENNIFER HAZELWOOD RN.@0916  ON 2.8.2020 BY TCALDWELL MT.    D-Dimer, Quant >20.00 (H) 0.00 - 0.50 ug/mL-FEU    Comment: REPEATED TO VERIFY CONSISTENT WITH PREVIOUS RESULT (NOTE) At the manufacturer cut-off of 0.50 ug/mL FEU, this assay has been documented to exclude PE with a sensitivity and negative predictive value of 97 to 99%.  At this time, this assay has not been approved by the FDA to exclude DVT/VTE. Results should be correlated with clinical presentation.    Platelets 98 (L) 150 - 400 K/uL    Comment: SPECIMEN CHECKED FOR CLOTS REPEATED TO VERIFY CONSISTENT WITH PREVIOUS RESULT    Smear Review Schistocytes present     Comment: Performed at [facility], [address] [place], [place] [address]  Comprehensive metabolic panel     Status: Abnormal   Collection Time: 11/16/18  7:39 AM  Result Value Ref Range   Sodium 134 (L) 135 - 145 mmol/L   Potassium 3.8 3.5 - 5.1 mmol/L   Chloride 106 98 - 111 mmol/L   CO2 22 22 - 32 mmol/L   Glucose, Bld 102 (H) 70 - 99 mg/dL   BUN 12 6 - 20 mg/dL   Creatinine, Ser 6.040.69 0.44 - 1.00 mg/dL   Calcium 7.8 (L) 8.9 - 10.3 mg/dL   Total Protein 5.5 (L) 6.5 - 8.1 g/dL   Albumin 2.6 (L) 3.5 - 5.0 g/dL   AST 27 15 - 41 U/L   ALT 24 0 - 44 U/L   Alkaline Phosphatase 66 38 - 126 U/L   Total Bilirubin 0.7 0.3 - 1.2 mg/dL   GFR calc non Af Amer >60 >60 mL/min   GFR calc Af Amer >60 >60 mL/min   Anion gap 6 5 - 15    Comment: Performed at [facility], [address] [place], [place] [address]  CBC     Status: Abnormal   Collection Time: 11/16/18  7:39 AM  Result Value Ref Range   WBC 11.4 (H) 4.0 - 10.5 K/uL   RBC 2.37 (L) 3.87 - 5.11 MIL/uL   Hemoglobin 6.5 (LL) 12.0 - 15.0 g/dL    Comment: REPEATED TO VERIFY CRITICAL RESULT CALLED TO, READ BACK BY AND VERIFIED  WITH: DR. Macon LargeANYANWU @0801  ON 2.8.2020 BY TCALDWELL MT.    HCT 19.9 (L) 36.0 - 46.0 %   MCV 84.0 80.0 - 100.0 fL   MCH 27.4 26.0 - 34.0 pg   MCHC 32.7 30.0 - 36.0 g/dL   RDW 11.914.8 14.711.5 - 82.915.5 %   Platelets 100 (L) 150 - 400 K/uL    Comment: SPECIMEN CHECKED FOR CLOTS REPEATED TO VERIFY CONSISTENT WITH PREVIOUS RESULT    nRBC 0.0 0.0 - 0.2 %    Comment: Performed at [facility], [address] [place], [place] [address]  BPAM FFP     Status: None (Preliminary result)   Collection Time: 11/16/18  7:39 AM  Result Value Ref Range   Blood Product Unit Number [id number]    Unit Type  and Rh 5100    Blood Product Expiration Date [id number]    Blood Product Unit Number [id number]    Unit Type and Rh 5100    Blood Product Expiration Date [id number]   DIC (disseminated intravasc coag) panel     Status: Abnormal   Collection Time: 11/16/18  1:54 PM  Result Value Ref Range   Prothrombin Time 14.6 11.4 - 15.2 seconds   INR 1.15    aPTT 31 24 - 36 seconds   Fibrinogen 174 (L) 210 - 475 mg/dL   D-Dimer, Quant >29.52>20.00 (H) 0.00 - 0.50 ug/mL-FEU    Comment: REPEATED TO VERIFY CONSISTENT WITH PREVIOUS RESULT (NOTE) At the manufacturer cut-off of 0.50 ug/mL FEU, this assay has been documented to exclude PE with a sensitivity and negative predictive value of 97 to 99%.  At this time, this assay has not been approved by the FDA to exclude DVT/VTE. Results should be correlated with clinical presentation.    Platelets 84 (L) 150 - 400 K/uL    Comment: SPECIMEN CHECKED FOR CLOTS REPEATED TO VERIFY CONSISTENT WITH PREVIOUS RESULT    Smear Review Schistocytes present     Comment: Performed at [facility], [address] [place], [place] [address]  Hemoglobin and hematocrit, blood     Status: Abnormal   Collection Time: 11/16/18  1:54 PM  Result Value Ref Range   Hemoglobin 8.3 (L) 12.0 - 15.0 g/dL    Comment: REPEATED TO VERIFY DELTA CHECK NOTED POST TRANSFUSION SPECIMEN     HCT 24.9 (L) 36.0 - 46.0 %    Comment: Performed at [facility], [address] [place], [place] [address]  CBC     Status: Abnormal   Collection Time: 11/16/18  8:09 PM  Result Value Ref Range   WBC 10.0 4.0 - 10.5 K/uL   RBC 2.81 (L) 3.87 - 5.11 MIL/uL   Hemoglobin 7.6 (L) 12.0 - 15.0 g/dL   HCT 27.223.2 (L) 53.636.0 - 64.446.0 %   MCV 82.6 80.0 - 100.0 fL   MCH 27.0 26.0 - 34.0 pg   MCHC 32.8 30.0 - 36.0 g/dL   RDW 03.414.8 74.211.5 - 59.515.5 %   Platelets 85 (L) 150 - 400 K/uL    Comment: SPECIMEN CHECKED FOR CLOTS REPEATED TO VERIFY CONSISTENT WITH PREVIOUS RESULT    nRBC 0.2 0.0 - 0.2 %    Comment: Performed at [facility], [address] [place], [place] [address]  Comprehensive metabolic panel     Status: Abnormal   Collection Time: 11/16/18  8:09 PM  Result Value Ref Range   Sodium 136 135 - 145 mmol/L   Potassium 3.6 3.5 - 5.1 mmol/L   Chloride 108 98 - 111 mmol/L   CO2 23 22 - 32 mmol/L   Glucose, Bld 89 70 - 99 mg/dL   BUN 7 6 - 20 mg/dL   Creatinine, Ser 6.430.69 0.44 - 1.00 mg/dL   Calcium 7.8 (L) 8.9 - 10.3 mg/dL   Total Protein 5.1 (L) 6.5 - 8.1 g/dL   Albumin 2.5 (L) 3.5 - 5.0 g/dL   AST 26 15 - 41 U/L   ALT 22 0 - 44 U/L   Alkaline Phosphatase 54 38 - 126 U/L   Total Bilirubin 0.4 0.3 - 1.2 mg/dL   GFR calc non Af Amer >60 >60 mL/min   GFR calc Af Amer >60 >60 mL/min   Anion gap 5 5 - 15    Comment: Performed at [facility], [address] [place] [address], [place], [place] [address]  Fibrinogen     Status: Abnormal   Collection Time: 11/16/18  8:09 PM  Result Value Ref Range   Fibrinogen 208 (L) 210 - 475 mg/dL    Comment: Performed at [facility], [address], [place], [place] [address]  APTT     Status: None   Collection Time: 11/16/18  8:09 PM  Result Value Ref Range   aPTT 31 24 - 36 seconds    Comment: Performed at [facility], [address] [place], [place] [address]  Protime-INR     Status: None   Collection Time: 11/16/18  8:09 PM  Result Value  Ref Range   Prothrombin Time 13.7 11.4 - 15.2 seconds   INR 1.06     Comment: Performed at [facility], [address], [place], [place] [address]  CBC     Status: Abnormal   Collection Time: 11/17/18  6:19 AM  Result Value Ref Range   WBC 7.9 4.0 - 10.5 K/uL   RBC 2.74 (L) 3.87 - 5.11 MIL/uL   Hemoglobin 7.4 (L) 12.0 - 15.0 g/dL   HCT 29.5 (L) 62.1 - 30.8 %   MCV 82.8 80.0 - 100.0 fL   MCH 27.0 26.0 - 34.0 pg   MCHC 32.6 30.0 - 36.0 g/dL   RDW 65.7 84.6 - 96.2 %   Platelets 89 (L) 150 - 400 K/uL    Comment: SPECIMEN CHECKED FOR CLOTS REPEATED TO VERIFY CONSISTENT WITH PREVIOUS RESULT    nRBC 0.0 0.0 - 0.2 %    Comment: Performed at [facility], [address], [place], [place] [address]  Comprehensive metabolic panel     Status: Abnormal   Collection Time: 11/17/18  6:19 AM  Result Value Ref Range   Sodium 135 135 - 145 mmol/L   Potassium 3.3 (L) 3.5 - 5.1 mmol/L   Chloride 108 98 - 111 mmol/L   CO2 23 22 - 32 mmol/L   Glucose, Bld 81 70 - 99 mg/dL   BUN 7 6 - 20 mg/dL   Creatinine, Ser 1.32 0.44 - 1.00 mg/dL   Calcium 7.8 (L) 8.9 - 10.3 mg/dL   Total Protein 5.4 (L) 6.5 - 8.1 g/dL   Albumin 2.5 (L) 3.5 - 5.0 g/dL   AST 23 15 - 41 U/L   ALT 22 0 - 44 U/L   Alkaline Phosphatase 61 38 - 126 U/L   Total Bilirubin 0.3 0.3 - 1.2 mg/dL   GFR calc non Af Amer >60 >60 mL/min   GFR calc Af Amer >60 >60 mL/min   Anion gap 4 (L) 5 - 15    Comment: Performed at [facility], [address] [place], [place] [address]  Fibrinogen     Status: None   Collection Time: 11/17/18  6:19 AM  Result Value Ref Range   Fibrinogen 253 210 - 475 mg/dL    Comment: Performed at [facility], [address], [place], [place] [address]  Protime-INR     Status: None   Collection Time: 11/17/18  6:19 AM  Result Value Ref Range   Prothrombin Time 13.1 11.4 - 15.2 seconds   INR 1.00     Comment: Performed at [facility], [address], [place], [place] [address]  Platelet  count     Status: Abnormal   Collection Time: 11/17/18  4:36 PM  Result Value Ref Range   Platelets 90 (L) 150 - 400 K/uL    Comment: SPECIMEN CHECKED  FOR CLOTS REPEATED TO VERIFY CONSISTENT WITH PREVIOUS RESULT Performed at [facility], [address], [place], [place] [address]   CBC     Status: Abnormal   Collection Time: 11/18/18  5:43 AM  Result Value Ref Range   WBC 7.2 4.0 - 10.5 K/uL   RBC 2.66 (L) 3.87 - 5.11 MIL/uL   Hemoglobin 7.2 (L) 12.0 - 15.0 g/dL   HCT 81.1 (L) 91.4 - 78.2 %   MCV 84.2 80.0 - 100.0 fL   MCH 27.1 26.0 - 34.0 pg   MCHC 32.1 30.0 - 36.0 g/dL   RDW 95.6 21.3 - 08.6 %   Platelets 104 (L) 150 - 400 K/uL    Comment: SPECIMEN CHECKED FOR CLOTS REPEATED TO VERIFY CONSISTENT WITH PREVIOUS RESULT    nRBC 0.0 0.0 - 0.2 %    Comment: Performed at [facility], [address], [place], [place] [address]  Comprehensive metabolic panel     Status: Abnormal   Collection Time: 11/18/18  5:43 AM  Result Value Ref Range   Sodium 137 135 - 145 mmol/L   Potassium 3.6 3.5 - 5.1 mmol/L   Chloride 111 98 - 111 mmol/L   CO2 22 22 - 32 mmol/L   Glucose, Bld 83 70 - 99 mg/dL   BUN 11 6 - 20 mg/dL   Creatinine, Ser 9.62 0.44 - 1.00 mg/dL   Calcium 7.7 (L) 8.9 - 10.3 mg/dL   Total Protein 5.1 (L) 6.5 - 8.1 g/dL   Albumin 2.4 (L) 3.5 - 5.0 g/dL   AST 16 15 - 41 U/L   ALT 18 0 - 44 U/L   Alkaline Phosphatase 52 38 - 126 U/L   Total Bilirubin 0.4 0.3 - 1.2 mg/dL   GFR calc non Af Amer >60 >60 mL/min   GFR calc Af Amer >60 >60 mL/min   Anion gap 4 (L) 5 - 15    Comment: Performed at [facility], [address], [place], [place] [address]   [place] Mfm Ob Limited  Result Date: 11/16/2018 ----------------------------------------------------------------------  OBSTETRICS REPORT                        (Signed Final 11/16/2018 07:45 am) ---------------------------------------------------------------------- Patient Info  ID #:       [id number]                           D.O.B.:  [birth year] (32 yrs)  Name:       Theresa Ochoa                    Visit Date: 11/16/2018 02:19 am ---------------------------------------------------------------------- Performed By  Performed By:     Hurman Horn          Referred By:       MAU Nursing-                    RDMS                                      MAU/Triage  Attending:        Noralee Space MD        Location:          [facility] ---------------------------------------------------------------------- Orders   #  Description  Code         Ordered By   1  [place] MFM OB LIMITED                    [id number]     LISA LEFTWICH-                                                        KIRBY  ----------------------------------------------------------------------   #  Order #                    Accession #                 Episode #   1  [id number]                  [id number]                  [id number]  ---------------------------------------------------------------------- Indications   [redacted] weeks gestation of pregnancy                Z3A.25   Pelvic pain affecting pregnancy in second      O26.892   trimester   Vaginal bleeding in pregnancy, second          O46.92   trimester  ---------------------------------------------------------------------- Fetal Evaluation  Num Of Fetuses:          1  Cardiac Activity:        Absent  Presentation:            Cephalic  Placenta:                Posterior ---------------------------------------------------------------------- OB History  Gravidity:    2         Term:   1 ---------------------------------------------------------------------- Gestational Age  LMP:           22w 6d        Date:  06/09/18                 EDD:   03/16/19  Clinical EDD:  25w 1d                                        EDD:   02/28/19  Best:          25w 1d     Det. By:  Clinical EDD             EDD:   02/28/19 ---------------------------------------------------------------------- Impression  A limited ultrasound study  was performed. Unfortunately, no  fetal heart activity was detected.  Impression: Fetal death. ----------------------------------------------------------------------                  Noralee Space, MD Electronically Signed Final Report   11/16/2018 07:45 am ----------------------------------------------------------------------   Discharge Exam: Blood pressure 110/72, pulse 72, temperature 98.4 F (36.9 C), temperature source Oral, resp. rate 18, height 5\' 7"  (1.702 m), weight 77.1 kg, last menstrual period 06/09/2018, SpO2 100 %, unknown if currently breastfeeding. General appearance: alert and no distress Resp: clear to auscultation bilaterally Cardio: regular rate and rhythm GI: soft, non-tender; bowel sounds normal; no masses,  no organomegaly Pelvic: deferred Extremities: extremities normal, atraumatic, no cyanosis or edema and Homans sign  is negative, no sign of DVT Pulses: 2+ and symmetric Skin: Skin color, texture, turgor normal. No rashes or lesions Neurologic: Alert and oriented X 3, normal strength and tone. Normal symmetric reflexes. Normal coordination and gait  Disposition: Discharge disposition: 01-Home or Self Care      Follow-up Information    [facility] Follow up in 1 week(s).   Specialty:  Obstetrics and Gynecology Why:  For Mood Check  2-3 weeks: Follow up visit Contact information: [address] [place] [address] [phone]             Allergies as of 11/18/2018   No Known Allergies     Medication List    TAKE these medications   ibuprofen 600 MG tablet Commonly known as:  ADVIL,MOTRIN Take 1 tablet (600 mg total) by mouth every 6 (six) hours as needed for moderate pain or cramping.      Follow-up Information    [facility] Follow up in 1 week(s).   Specialty:  Obstetrics and Gynecology Why:  For Mood Check  2-3 weeks: Follow up visit Contact information: [address] [place] [address] [phone]          Signed: Jaynie Collins 11/18/2018, 2:39 PM

## 2018-11-19 LAB — TORCH-IGM(TOXO/ RUB/ CMV/ HSV) W TITER
CMV IgM: <30 AU/mL (ref 0.0–29.9)
Rubella IgM: <20 AU/mL (ref 0.0–19.9)
Toxoplasma Antibody- IgM: <3 AU/mL (ref 0.0–7.9)

## 2018-11-19 LAB — INFECT DISEASE AB IGM REFLEX 1

## 2018-11-20 LAB — BPAM RBC
BLOOD PRODUCT EXPIRATION DATE: 202003102359
Blood Product Expiration Date: 202003102359
Blood Product Expiration Date: 202003102359
ISSUE DATE / TIME: 202002080819
ISSUE DATE / TIME: 202002081152
Unit Type and Rh: 5100
Unit Type and Rh: 5100
Unit Type and Rh: 5100

## 2018-11-20 LAB — TYPE AND SCREEN
ABO/RH(D): O POS
Antibody Screen: NEGATIVE
Unit division: 0
Unit division: 0
Unit division: 0

## 2018-11-22 LAB — BPAM FFP
Blood Product Expiration Date: 202002132359
Blood Product Expiration Date: 202002132359
Unit Type and Rh: 5100
Unit Type and Rh: 5100

## 2018-11-22 LAB — PREPARE FRESH FROZEN PLASMA

## 2018-11-25 ENCOUNTER — Ambulatory Visit (INDEPENDENT_AMBULATORY_CARE_PROVIDER_SITE_OTHER): Payer: Medicaid Other | Admitting: Clinical

## 2018-11-25 DIAGNOSIS — F4321 Adjustment disorder with depressed mood: Secondary | ICD-10-CM

## 2018-11-25 NOTE — BH Specialist Note (Signed)
Integrated Behavioral Health Initial Visit  MRN: [id number] Name: Theresa Ochoa  Number of Integrated Behavioral Health Clinician visits:: 1/6 Session Start time: 10:47  Session End time: 11:58 Total time: 1 hour  Type of Service: Integrated Behavioral Health- Individual/Family Interpretor:No. Interpretor Name and Language: n/a   Warm Hand Off Completed.       SUBJECTIVE: Theresa Ochoa is a 33 y.o. female accompanied by n/a Patient was referred by Jaynie Collins, MD for mood check. Patient reports the following symptoms/concerns: Pt states her primary concern today is grieving the loss of her baby at [redacted]wk gestation (11/16/2018) along with grieving the loss of her mother (10/08/2018); pt feels overwhelmed with emotion. Current losses are triggering feelings from previous losses 10 years prior (19yo brother, followed by 27yo children's father). No SI, no HI.  Duration of problem: 9 days most recent loss; Severity of problem: severe  OBJECTIVE: Mood: Grief and Affect: Appropriate and Tearful Risk of harm to self or others: No plan to harm self or others  LIFE CONTEXT: Family and Social: Pt lives with her husband (married 2 years), her children (16,13,10); her brother (13yo). Pt's brother came to live with her after her mother passed away with heart attack New Year's Eve.  School/Work: Husband is working Self-Care: Recognizing a greater need for self-care at this time  Life Changes: Loss of baby at [redacted] wk gestation (9 days ago), loss of mother due to heart attack (Over one month). Married to husband 2 years ago; loss of brother (19yo), then father of older children (27yo) 10 years prior.   GOALS ADDRESSED: Patient will: 1. Reduce symptoms of: anxiety, depression, insomnia and stress 2. Increase knowledge and/or ability of: healthy habits and self-management skills  3. Demonstrate ability to: Increase healthy adjustment to current life circumstances and Increase adequate support systems  for patient/family  INTERVENTIONS: Interventions utilized: Mindfulness or Management consultant, Psychoeducation and/or Health Education and Link to [employer]  Standardized Assessments completed: GAD-7 and PHQ 9  ASSESSMENT: Patient currently experiencing Grief.   Patient may benefit from psychoeducation and brief therapeutic interventions regarding coping with symptoms of anxiety and depression, as it relates to grief.  Marland Kitchen  PLAN: 1. Follow up with behavioral health clinician on : As needed 2. Behavioral recommendations:  -Consider using self-coping strategies daily, as discussed, for as long as remain helpful -Read educational materials regarding coping with symptoms of anxiety and depression  3. Referral(s): Integrated [facility] [facility] (In Clinic) 4. "From scale of 1-10, how likely are you to follow plan?": 10  Rae Lips, LCSW  Depression screen [facility] 2/9 11/25/2018  Decreased Interest 2  Down, Depressed, Hopeless 2  PHQ - 2 Score 4  Altered sleeping 2  Tired, decreased energy 2  Change in appetite 2  Feeling bad or failure about yourself  2  Trouble concentrating 2  Moving slowly or fidgety/restless 2  Suicidal thoughts 0  PHQ-9 Score 16   GAD 7 : Generalized Anxiety Score 11/25/2018  Nervous, Anxious, on Edge 2  Control/stop worrying 2  Worry too much - different things 2  Trouble relaxing 2  Restless 2  Easily annoyed or irritable 2  Afraid - awful might happen 2  Total GAD 7 Score 14

## 2018-12-04 ENCOUNTER — Ambulatory Visit: Payer: Self-pay | Admitting: Obstetrics & Gynecology

## 2019-10-04 ENCOUNTER — Other Ambulatory Visit: Payer: Self-pay

## 2019-10-04 ENCOUNTER — Emergency Department (HOSPITAL_BASED_OUTPATIENT_CLINIC_OR_DEPARTMENT_OTHER): Payer: Medicaid Other

## 2019-10-04 ENCOUNTER — Encounter (HOSPITAL_BASED_OUTPATIENT_CLINIC_OR_DEPARTMENT_OTHER): Payer: Self-pay

## 2019-10-04 ENCOUNTER — Emergency Department (HOSPITAL_BASED_OUTPATIENT_CLINIC_OR_DEPARTMENT_OTHER)
Admission: EM | Admit: 2019-10-04 | Discharge: 2019-10-05 | Disposition: A | Discharge status: Other Acute Inpatient Hospital | Payer: Medicaid Other | Attending: Emergency Medicine | Admitting: Emergency Medicine

## 2019-10-04 DIAGNOSIS — R45851 Suicidal ideations: Secondary | ICD-10-CM | POA: Insufficient documentation

## 2019-10-04 DIAGNOSIS — R0789 Other chest pain: Secondary | ICD-10-CM | POA: Insufficient documentation

## 2019-10-04 DIAGNOSIS — F322 Major depressive disorder, single episode, severe without psychotic features: Secondary | ICD-10-CM | POA: Insufficient documentation

## 2019-10-04 DIAGNOSIS — Z87891 Personal history of nicotine dependence: Secondary | ICD-10-CM | POA: Insufficient documentation

## 2019-10-04 DIAGNOSIS — K219 Gastro-esophageal reflux disease without esophagitis: Secondary | ICD-10-CM | POA: Insufficient documentation

## 2019-10-04 DIAGNOSIS — Z20828 Contact with and (suspected) exposure to other viral communicable diseases: Secondary | ICD-10-CM | POA: Insufficient documentation

## 2019-10-04 DIAGNOSIS — F4310 Post-traumatic stress disorder, unspecified: Secondary | ICD-10-CM | POA: Insufficient documentation

## 2019-10-04 LAB — PREGNANCY, URINE: Preg Test, Ur: NEGATIVE

## 2019-10-04 LAB — RAPID URINE DRUG SCREEN, HOSP PERFORMED
Amphetamines: POSITIVE — AB
Barbiturates: NOT DETECTED
Benzodiazepines: NOT DETECTED
Cocaine: NOT DETECTED
Opiates: NOT DETECTED
Tetrahydrocannabinol: POSITIVE — AB

## 2019-10-04 LAB — HEPATIC FUNCTION PANEL
ALT: 11 U/L (ref 0–44)
AST: 16 U/L (ref 15–41)
Albumin: 4.3 g/dL (ref 3.5–5.0)
Alkaline Phosphatase: 44 U/L (ref 38–126)
Bilirubin, Direct: <0.1 mg/dL (ref 0.0–0.2)
Total Bilirubin: 0.9 mg/dL (ref 0.3–1.2)
Total Protein: 7.9 g/dL (ref 6.5–8.1)

## 2019-10-04 LAB — TROPONIN I (HIGH SENSITIVITY): Troponin I (High Sensitivity): <2 ng/L (ref <18)

## 2019-10-04 LAB — CBC
HCT: 42 % (ref 36.0–46.0)
Hemoglobin: 13.7 g/dL (ref 12.0–15.0)
MCH: 28 pg (ref 26.0–34.0)
MCHC: 32.6 g/dL (ref 30.0–36.0)
MCV: 85.7 fL (ref 80.0–100.0)
Platelets: 307 10*3/uL (ref 150–400)
RBC: 4.9 MIL/uL (ref 3.87–5.11)
RDW: 14.6 % (ref 11.5–15.5)
WBC: 3.7 10*3/uL — ABNORMAL LOW (ref 4.0–10.5)
nRBC: 0 % (ref 0.0–0.2)

## 2019-10-04 LAB — BASIC METABOLIC PANEL
Anion gap: 13 (ref 5–15)
BUN: 11 mg/dL (ref 6–20)
CO2: 22 mmol/L (ref 22–32)
Calcium: 9.2 mg/dL (ref 8.9–10.3)
Chloride: 103 mmol/L (ref 98–111)
Creatinine, Ser: 0.7 mg/dL (ref 0.44–1.00)
GFR calc Af Amer: >60 mL/min (ref >60)
GFR calc non Af Amer: >60 mL/min (ref >60)
Glucose, Bld: 82 mg/dL (ref 70–99)
Potassium: 3.2 mmol/L — ABNORMAL LOW (ref 3.5–5.1)
Sodium: 138 mmol/L (ref 135–145)

## 2019-10-04 LAB — ACETAMINOPHEN LEVEL: Acetaminophen (Tylenol), Serum: <10 ug/mL — ABNORMAL LOW (ref 10–30)

## 2019-10-04 LAB — SALICYLATE LEVEL: Salicylate Lvl: <7 mg/dL — ABNORMAL LOW (ref 7.0–30.0)

## 2019-10-04 LAB — ETHANOL: Alcohol, Ethyl (B): <10 mg/dL (ref <10)

## 2019-10-04 LAB — LIPASE, BLOOD: Lipase: 21 U/L (ref 11–51)

## 2019-10-04 MED ORDER — ALUM & MAG HYDROXIDE-SIMETH 200-200-20 MG/5ML PO SUSP: 30.0000 mL | Freq: Four times a day (QID) | ORAL | Status: DC | PRN

## 2019-10-04 MED ORDER — HYDROXYZINE HCL 25 MG PO TABS
50.0000 mg | ORAL_TABLET | Freq: Once | ORAL | Status: AC
  Administered 2019-10-04: 50 mg via ORAL
  Filled 2019-10-04: qty 2

## 2019-10-04 MED ORDER — LIDOCAINE VISCOUS HCL 2 % MT SOLN
15.0000 mL | Freq: Once | OROMUCOSAL | Status: AC
  Administered 2019-10-04: 15 mL via ORAL
  Filled 2019-10-04: qty 15

## 2019-10-04 MED ORDER — ONDANSETRON HCL 8 MG PO TABS: 4.0000 mg | ORAL_TABLET | Freq: Three times a day (TID) | ORAL | Status: DC | PRN

## 2019-10-04 MED ORDER — FAMOTIDINE 20 MG PO TABS
20.0000 mg | ORAL_TABLET | Freq: Once | ORAL | Status: AC
  Administered 2019-10-04: 20 mg via ORAL
  Filled 2019-10-04: qty 1

## 2019-10-04 MED ORDER — ALUM & MAG HYDROXIDE-SIMETH 200-200-20 MG/5ML PO SUSP
30.0000 mL | Freq: Once | ORAL | Status: AC
  Administered 2019-10-04: 30 mL via ORAL
  Filled 2019-10-04: qty 30

## 2019-10-04 NOTE — ED Notes (Signed)
Pts Belongings bagged and labeled.
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
## 2019-10-04 NOTE — ED Triage Notes (Signed)
Pt states that she has been having Chest pains for about 2 weeks. Pt reports that it does down into her stomach, reports pain is worse with lying.

## 2019-10-04 NOTE — ED Provider Notes (Signed)
[place] EMERGENCY DEPARTMENT Provider Note   CSN: [id number] Arrival date & time: 10/04/19  1722     History Chief Complaint  Patient presents with  . Chest Pain  . Suicidal    Theresa Ochoa is a 33 y.o. female.  33 year old female with past medical history including anxiety/depression, DIC who presents with chest pain and depression.  Patient reports 2 weeks of constant, central chest pain.  Sometimes it is worse with certain positions when she lies down.  She also reports that the pain sometimes radiates down into her stomach.  She reports significant and frequent heartburn symptoms.  She admits to taking NSAIDs frequently.  Occasional alcohol use.  She has had some nausea and vomiting before with her pain symptoms.  No cough or shortness of breath.  Patient also notes worsening problems with depression including sleep disturbance.  She reports stressors of losing her mother this year and having a miscarriage at 68 weeks.  She has also been having problems with her husband.  She sent her other children to live with her grandmother because of her worsening depression.  She reports thoughts of not wanting to live anymore but denies any plan for suicide.  The history is provided by the patient.  Chest Pain      Past Medical History:  Diagnosis Date  . Anxiety     Patient Active Problem List   Diagnosis Date Noted  . Fetal demise at 25 weeks, antepartum 11/16/2018  . Placental abruption, second trimester 11/16/2018  . Anemia of pregnancy in second trimester 11/16/2018  . DIC (disseminated intravascular coagulation) ([place]) 11/16/2018    Past Surgical History:  Procedure Laterality Date  . CESAREAN SECTION     x 3     OB History    Gravida  5   Para  4   Term  3   Preterm  1   AB  1   Living  3     SAB      TAB      Ectopic      Multiple  0   Live Births              History reviewed. No pertinent family history.  Social History    Tobacco Use  . Smoking status: Former Research scientist (life sciences)  . Smokeless tobacco: Never Used  Substance Use Topics  . Alcohol use: Yes    Comment: occ  . Drug use: Not Currently    Types: Marijuana    Home Medications Prior to Admission medications   Medication Sig Start Date End Date Taking? Authorizing Provider  ibuprofen (ADVIL,MOTRIN) 600 MG tablet Take 1 tablet (600 mg total) by mouth every 6 (six) hours as needed for moderate pain or cramping. 11/18/18   Osborne Oman, MD    Allergies    Patient has no known allergies.  Review of Systems   Review of Systems  Cardiovascular: Positive for chest pain.   All other systems reviewed and are negative except that which was mentioned in HPI  Physical Exam Updated Vital Signs BP 100/68   Pulse 77   Temp 98.1 F (36.7 C) (Oral)   Resp 18   Ht 5\' 7"  (1.702 m)   Wt 70.3 kg   LMP 08/17/2019   SpO2 96%   BMI 24.28 kg/m   Physical Exam Vitals and nursing note reviewed.  Constitutional:      General: She is not in acute distress.    Appearance: She  is well-developed.  HENT:     Head: Normocephalic and atraumatic.  Eyes:     Conjunctiva/sclera: Conjunctivae normal.  Cardiovascular:     Rate and Rhythm: Normal rate and regular rhythm.     Heart sounds: Normal heart sounds. No murmur.  Pulmonary:     Effort: Pulmonary effort is normal.     Breath sounds: Normal breath sounds.  Abdominal:     General: Bowel sounds are normal. There is no distension.     Palpations: Abdomen is soft.     Tenderness: There is no abdominal tenderness.  Musculoskeletal:     Cervical back: Neck supple.  Skin:    General: Skin is warm and dry.  Neurological:     Mental Status: She is alert and oriented to person, place, and time.     Comments: Fluent speech  Psychiatric:        Judgment: Judgment normal.     Comments: Calm, good eye contact, cooperative     ED Results / Procedures / Treatments   Labs (all labs ordered are listed, but only  abnormal results are displayed) Labs Reviewed  BASIC METABOLIC PANEL - Abnormal; Notable for the following components:      Result Value   Potassium 3.2 (*)    All other components within normal limits  CBC - Abnormal; Notable for the following components:   WBC 3.7 (*)    All other components within normal limits  SALICYLATE LEVEL - Abnormal; Notable for the following components:   Salicylate Lvl <7.0 (*)    All other components within normal limits  ACETAMINOPHEN LEVEL - Abnormal; Notable for the following components:   Acetaminophen (Tylenol), Serum <10 (*)    All other components within normal limits  RAPID URINE DRUG SCREEN, HOSP PERFORMED - Abnormal; Notable for the following components:   Amphetamines POSITIVE (*)    Tetrahydrocannabinol POSITIVE (*)    All other components within normal limits  SARS CORONAVIRUS 2 BY RT PCR (HOSPITAL ORDER, PERFORMED IN [facility])  PREGNANCY, URINE  ETHANOL  LIPASE, BLOOD  HEPATIC FUNCTION PANEL  TROPONIN I (HIGH SENSITIVITY)    EKG EKG Interpretation  Date/Time:  Saturday October 04 2019 17:31:14 EST Ventricular Rate:  92 PR Interval:  148 QRS Duration: 80 QT Interval:  350 QTC Calculation: 432 R Axis:   72 Text Interpretation: Normal sinus rhythm with sinus arrhythmia Normal ECG similar to previous Confirmed by Frederick PeersLittle, Valeria Boza [phone]([id number]) on 10/04/2019 7:04:45 PM   Radiology DG Chest 2 View  Result Date: 10/04/2019 CLINICAL DATA:  Chest pain for 2 weeks, increased pain when lying down, former smoker, lot of stress EXAM: CHEST - 2 VIEW COMPARISON:  10/08/2018 FINDINGS: Normal heart size, mediastinal contours, and pulmonary vascularity. Lungs clear. No pleural effusion or pneumothorax. Bones unremarkable. IMPRESSION: Normal exam. Electronically Signed   By: Ulyses SouthwardMark  Boles M.D.   On: 10/04/2019 19:25    Procedures Procedures (including critical care time)  Medications Ordered in ED Medications  ondansetron (ZOFRAN)  tablet 4 mg (has no administration in time range)  alum & mag hydroxide-simeth (MAALOX/MYLANTA) 200-200-20 MG/5ML suspension 30 mL (has no administration in time range)  alum & mag hydroxide-simeth (MAALOX/MYLANTA) 200-200-20 MG/5ML suspension 30 mL (30 mLs Oral Given 10/04/19 1952)    And  lidocaine (XYLOCAINE) 2 % viscous mouth solution 15 mL (15 mLs Oral Given 10/04/19 1952)  famotidine (PEPCID) tablet 20 mg (20 mg Oral Given 10/04/19 1952)  hydrOXYzine (ATARAX/VISTARIL) tablet 50 mg (50 mg  Oral Given 10/04/19 2152)    ED Course  I have reviewed the triage vital signs and the nursing notes.  Pertinent labs & imaging results that were available during my care of the patient were reviewed by me and considered in my medical decision making (see chart for details).    MDM Rules/Calculators/A&P                      Chest pain: CXR and EKG reassuring, basic labs and trop normal. symptoms sound very atypical for ACS, given they have been constant for 2 weeks I feel that single troponin is sufficient. I do suspect she may be having GERD symptoms and is at risk for PUD given frequent NSAID use. Counseled on stopping NSAIDS and avoiding alcohol. Will start on daily pepcid and add tums as needed.   Depression: given worsening symptoms, contacted TTS for evaluation. Screening labs are reassuring and pt medically clear.  Spoke w/ behavioral health, Chanetta Marshall, who informed me that psychiatry team has recommended inpatient treatment.  Patient has a bed at behavioral health and will be transferred there as soon as screening COVID-19 testing is complete. Final Clinical Impression(s) / ED Diagnoses Final diagnoses:  Current severe episode of major depressive disorder without psychotic features, unspecified whether recurrent (HCC)  Atypical chest pain  Gastroesophageal reflux disease, unspecified whether esophagitis present    Rx / DC Orders ED Discharge Orders    None       Ariel Wingrove, Ambrose Finland,  MD 10/04/19 2330

## 2019-10-04 NOTE — ED Triage Notes (Signed)
During triage pt reports that she lost her mother this year and a daughter at 58 weeks. Reports that she has been having some trouble with her husband and is pending homelessness. Also reports that she has sent her other children to live with their grandmother because of her depressions. Pt tearful in triage.

## 2019-10-04 NOTE — ED Notes (Signed)
Staffing office made aware of sitter need for suicidal pt.

## 2019-10-04 NOTE — ED Notes (Signed)
Belongings and pt wanded by security.

## 2019-10-04 NOTE — ED Notes (Signed)
Pt changed into paper scrubs, pt wanded by Security. Belongs bagged.

## 2019-10-04 NOTE — ED Notes (Signed)
Pt reports no medications at this time

## 2019-10-05 ENCOUNTER — Inpatient Hospital Stay (HOSPITAL_COMMUNITY)
Admission: AD | Admit: 2019-10-05 | Discharge: 2019-10-08 | DRG: 885 | Disposition: A | Discharge status: Home / Self Care | Payer: Medicaid Other | Source: Intra-hospital | Attending: Psychiatry | Admitting: Psychiatry

## 2019-10-05 ENCOUNTER — Encounter (HOSPITAL_COMMUNITY): Payer: Self-pay | Admitting: Nurse Practitioner

## 2019-10-05 DIAGNOSIS — Z818 Family history of other mental and behavioral disorders: Secondary | ICD-10-CM | POA: Diagnosis not present

## 2019-10-05 DIAGNOSIS — G4700 Insomnia, unspecified: Secondary | ICD-10-CM | POA: Diagnosis present

## 2019-10-05 DIAGNOSIS — F332 Major depressive disorder, recurrent severe without psychotic features: Principal | ICD-10-CM | POA: Diagnosis present

## 2019-10-05 DIAGNOSIS — R45851 Suicidal ideations: Secondary | ICD-10-CM | POA: Diagnosis present

## 2019-10-05 DIAGNOSIS — F17210 Nicotine dependence, cigarettes, uncomplicated: Secondary | ICD-10-CM | POA: Diagnosis present

## 2019-10-05 DIAGNOSIS — F4310 Post-traumatic stress disorder, unspecified: Secondary | ICD-10-CM | POA: Diagnosis present

## 2019-10-05 LAB — LIPID PANEL
Cholesterol: 171 mg/dL (ref 0–200)
HDL: 47 mg/dL (ref >40)
LDL Cholesterol: 112 mg/dL — ABNORMAL HIGH (ref 0–99)
Total CHOL/HDL Ratio: 3.6 RATIO
Triglycerides: 60 mg/dL (ref <150)
VLDL: 12 mg/dL (ref 0–40)

## 2019-10-05 LAB — TSH: TSH: 0.857 u[IU]/mL (ref 0.350–4.500)

## 2019-10-05 LAB — HEMOGLOBIN A1C
Hgb A1c MFr Bld: 5.3 % (ref 4.8–5.6)
Mean Plasma Glucose: 105.41 mg/dL

## 2019-10-05 LAB — SARS CORONAVIRUS 2 BY RT PCR (HOSPITAL ORDER, PERFORMED IN ~~LOC~~ HOSPITAL LAB): SARS Coronavirus 2: NEGATIVE

## 2019-10-05 MED ORDER — MAGNESIUM HYDROXIDE 400 MG/5ML PO SUSP: 30.0000 mL | Freq: Every day | ORAL | Status: DC | PRN

## 2019-10-05 MED ORDER — TRAZODONE HCL 50 MG PO TABS
50.0000 mg | ORAL_TABLET | Freq: Every evening | ORAL | Status: DC | PRN
  Administered 2019-10-06 – 2019-10-07 (×2): 50 mg via ORAL
  Filled 2019-10-05 (×4): qty 1

## 2019-10-05 MED ORDER — SERTRALINE HCL 50 MG PO TABS
50.0000 mg | ORAL_TABLET | Freq: Every day | ORAL | Status: DC
  Administered 2019-10-05 – 2019-10-08 (×4): 50 mg via ORAL
  Filled 2019-10-05 (×6): qty 1

## 2019-10-05 MED ORDER — ENSURE ENLIVE PO LIQD
237.0000 mL | Freq: Two times a day (BID) | ORAL | Status: DC
  Administered 2019-10-05 – 2019-10-07 (×3): 237 mL via ORAL

## 2019-10-05 MED ORDER — POTASSIUM CHLORIDE CRYS ER 20 MEQ PO TBCR
40.0000 meq | EXTENDED_RELEASE_TABLET | Freq: Once | ORAL | Status: AC
  Administered 2019-10-05: 40 meq via ORAL
  Filled 2019-10-05: qty 2

## 2019-10-05 MED ORDER — ACETAMINOPHEN 325 MG PO TABS: 650.0000 mg | ORAL_TABLET | Freq: Four times a day (QID) | ORAL | Status: DC | PRN

## 2019-10-05 MED ORDER — POTASSIUM CHLORIDE CRYS ER 20 MEQ PO TBCR
20.0000 meq | EXTENDED_RELEASE_TABLET | Freq: Two times a day (BID) | ORAL | Status: AC
  Administered 2019-10-05 – 2019-10-06 (×3): 20 meq via ORAL
  Filled 2019-10-05 (×3): qty 1

## 2019-10-05 MED ORDER — ALUM & MAG HYDROXIDE-SIMETH 200-200-20 MG/5ML PO SUSP: 30.0000 mL | ORAL | Status: DC | PRN

## 2019-10-05 MED ORDER — HYDROXYZINE HCL 25 MG PO TABS
25.0000 mg | ORAL_TABLET | Freq: Three times a day (TID) | ORAL | Status: DC | PRN
  Administered 2019-10-06 – 2019-10-07 (×3): 25 mg via ORAL
  Filled 2019-10-05 (×3): qty 1

## 2019-10-05 NOTE — Progress Notes (Signed)
NUTRITION ASSESSMENT  Pt identified as at risk on the Malnutrition Screen Tool  INTERVENTION: 1. Supplements: Ensure Enlive po BID, each supplement provides 350 kcal and 20 grams of protein  NUTRITION DIAGNOSIS: Unintentional weight loss related to sub-optimal intake as evidenced by pt report.   Goal: Pt to meet >/= 90% of their estimated nutrition needs.  Monitor:  PO intake  Assessment:  Pt admitted for depression. Pt reports having a poor appetite. Pt reports weight loss of 15 lbs over the past month. Per weight records, pt has lost 20 lbs since February 2020.  Will order Ensure supplements for additional kcals and protein.  Height: Ht Readings from Last 1 Encounters:  10/05/19 5\' 7"  (1.702 m)    Weight: Wt Readings from Last 1 Encounters:  10/05/19 68 kg    Weight Hx: Wt Readings from Last 10 Encounters:  10/05/19 68 kg  10/04/19 70.3 kg  11/16/18 77.1 kg  10/08/18 75 kg  10/08/18 75.3 kg  07/27/18 65.8 kg  05/09/18 66.2 kg  06/16/16 61.2 kg    BMI:  Body mass index is 23.49 kg/m. Pt meets criteria for normal based on current BMI.  Estimated Nutritional Needs: Kcal: 25-30 kcal/kg Protein: > 1 gram protein/kg Fluid: 1 ml/kcal  Diet Order:  Diet Order            Diet regular Room service appropriate? Yes; Fluid consistency: Thin  Diet effective now             Pt is also offered choice of unit snacks mid-morning and mid-afternoon.  Pt is eating as desired.   Lab results and medications reviewed.   Clayton Bibles, MS, RD, LDN Inpatient Clinical Dietitian Pager: [phone] After Hours Pager: [phone]
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
## 2019-10-05 NOTE — ED Notes (Signed)
Report given to [facility] at this time.  Belonging gathered and ready for transport.
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
## 2019-10-05 NOTE — [facility] Group Notes (Signed)
[facility] LCSW Group Therapy Note  10/05/2019  10:00-11:00AM  Type of Therapy and Topic:  Group Therapy:  A Hero Worthy of Support  Participation Level:  Did Not Attend   Description of Group:  Patients in this group were introduced to the concept that additional supports including self-support are an essential part of recovery.  Matching needs with supports to help fulfill those needs was explained.  A song "I Got To Live" was played for the group and was followed by a discussion of what it meant to participants.   The consensus was that the message was to give themselves permission to see happiness in life.  A song entitled "My Own Hero" was played and a group discussion ensued in which patients stated it inspired them to help themselves in order to succeed, because other people cannot achieve their goals such as sobriety or stability for them.  A song was played called "I Am Enough" which led to a discussion about being willing to believe we are worth the effort of being a self-support.   Therapeutic Goals: 1)  demonstrate the importance of being a key part of one's own support system 2)  discuss various available supports 3)  encourage patient to use music as part of their self-support and focus on goals 4)  elicit ideas from patients about supports that need to be added   Summary of Patient Progress:  The patient did not attend group  Therapeutic Modalities:   Motivational Interviewing Activity  Josee Speece J Grossman-Orr      

## 2019-10-05 NOTE — ED Notes (Signed)
Consent obtained for voluntary commitment to [facility].  Faxed at this time.

## 2019-10-05 NOTE — Progress Notes (Signed)
D. Pt presents with and anxious affect, depressed mood, calm and cooperative behavior. Pt stayed in bed for much of the morning. Pt endorses lingering passive SI- no plan- denies A/VH  A. Labs and vitals monitored. Pt compliant with medications. Pt provided with Gatorade and encouraged to drink.. Pt supported emotionally and encouraged to express concerns and ask questions.   R. Pt remains safe with 15 minute checks. Will continue POC.

## 2019-10-05 NOTE — ED Notes (Signed)
Attempted report.  Nurse to call back when available. 

## 2019-10-05 NOTE — Progress Notes (Signed)
Patient ID: Theresa Ochoa, female   DOB: 08-09-1986, 33 y.o.   MRN: [id number] D: Pt reclusive to her room, however, reports being much better today.  She reports that she still does not have an appetite, but will try to eat tomorrow.  Pt also reports that the Ensure which she had today upset her stomach and caused cramps, and that she does not want any more. Pt denies SI/HI/AVH, and states that she just wants to focus on being better.  A: Pt is being maintained on Q15 minute checks for safety, empathy and active listening and positive reinforcements provided.  R: Will continue to monitor on Q15 minute checks for safety.

## 2019-10-05 NOTE — ED Notes (Signed)
Left with safe transport at this time.

## 2019-10-05 NOTE — Progress Notes (Signed)
[place] NOVEL CORONAVIRUS (COVID-19) DAILY CHECK-OFF SYMPTOMS - answer yes or no to each - every day NO YES  Have you had a fever in the past 24 hours?  . Fever (Temp > 37.80C / 100F) X   Have you had any of these symptoms in the past 24 hours? . New Cough .  Sore Throat  .  Shortness of Breath .  Difficulty Breathing .  Unexplained Body Aches   X   Have you had any one of these symptoms in the past 24 hours not related to allergies?   . Runny Nose .  Nasal Congestion .  Sneezing   X   If you have had runny nose, nasal congestion, sneezing in the past 24 hours, has it worsened?  X   EXPOSURES - check yes or no X   Have you traveled outside the state in the past 14 days?  X   Have you been in contact with someone with a confirmed diagnosis of COVID-19 or PUI in the past 14 days without wearing appropriate PPE?  X   Have you been living in the same home as a person with confirmed diagnosis of COVID-19 or a PUI (household contact)?    X   Have you been diagnosed with COVID-19?    X              What to do next: Answered NO to all: Answered YES to anything:   Proceed with unit schedule Follow the [facility] Inpatient Flowsheet.   

## 2019-10-05 NOTE — Progress Notes (Signed)
  [facility] Group Notes:  (Nursing/MHT/Case Management/Adjunct)  Date:  10/05/2019  Time:  1300  Type of Therapy:  Nurse Education Discussed healthy support systems and created collages.   Participation Level:  Did Not Attend   Theresa Ochoa  

## 2019-10-06 NOTE — Progress Notes (Signed)
Recreation Therapy Notes  Date:  12.2820 Time: 0930 Location: [address] Dayroom  Group Topic: Stress Management  Goal Area(s) Addresses:  Patient will identify positive stress management techniques. Patient will identify benefits of using stress management post d/c.  Intervention:  Stress Management  Activity :  Meditation.  LRT played a meditation the focused on pure possibility.  The meditation lead patients to focus on something they want to accomplish during they day.  Patients were to listen to the meditation and follow along as the meditation played to engage.  Education:  Stress Management, Discharge Planning.   Education Outcome: Acknowledges Education  Clinical Observations/Feedback:  Pt did not attend activity.     Victorino Sparrow, LRT/CTRS         Victorino Sparrow A 10/06/2019 11:43 AM
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
## 2019-10-06 NOTE — Tx Team (Signed)
Interdisciplinary Treatment and Diagnostic Plan Update  10/06/2019 Time of Session:  Theresa Ochoa MRN: [id number]  Principal Diagnosis: <principal problem not specified>  Secondary Diagnoses: Active Problems:   Severe recurrent major depression without psychotic features ([place])   Current Medications:  Current Facility-Administered Medications  Medication Dose Route Frequency Provider Last Rate Last Admin  . acetaminophen (TYLENOL) tablet 650 mg  650 mg Oral Q6H PRN Lindon Romp A, NP      . alum & mag hydroxide-simeth (MAALOX/MYLANTA) 200-200-20 MG/5ML suspension 30 mL  30 mL Oral Q4H PRN Lindon Romp A, NP      . feeding supplement (ENSURE ENLIVE) (ENSURE ENLIVE) liquid 237 mL  237 mL Oral BID BM Cobos, Myer Peer, MD   237 mL at 10/05/19 1625  . hydrOXYzine (ATARAX/VISTARIL) tablet 25 mg  25 mg Oral TID PRN Rozetta Nunnery, NP   25 mg at 10/06/19 0825  . magnesium hydroxide (MILK OF MAGNESIA) suspension 30 mL  30 mL Oral Daily PRN Lindon Romp A, NP      . potassium chloride SA (KLOR-CON) CR tablet 20 mEq  20 mEq Oral BID Cobos, Myer Peer, MD   20 mEq at 10/06/19 0823  . sertraline (ZOLOFT) tablet 50 mg  50 mg Oral Daily Cobos, Myer Peer, MD   50 mg at 10/06/19 8811  . traZODone (DESYREL) tablet 50 mg  50 mg Oral QHS PRN Rozetta Nunnery, NP       PTA Medications: Medications Prior to Admission  Medication Sig Dispense Refill Last Dose  . ibuprofen (ADVIL,MOTRIN) 600 MG tablet Take 1 tablet (600 mg total) by mouth every 6 (six) hours as needed for moderate pain or cramping. 30 tablet 2     Patient Stressors:    Patient Strengths:    Treatment Modalities: Medication Management, Group therapy, Case management,  1 to 1 session with clinician, Psychoeducation, Recreational therapy.   Physician Treatment Plan for Primary Diagnosis: <principal problem not specified> Long Term Goal(s): Improvement in symptoms so as ready for discharge Improvement in symptoms so as ready for discharge    Short Term Goals: Ability to identify changes in lifestyle to reduce recurrence of condition will improve Ability to verbalize feelings will improve Ability to disclose and discuss suicidal ideas Ability to demonstrate self-control will improve Ability to identify and develop effective coping behaviors will improve Ability to maintain clinical measurements within normal limits will improve Compliance with prescribed medications will improve Ability to identify changes in lifestyle to reduce recurrence of condition will improve Ability to verbalize feelings will improve Ability to disclose and discuss suicidal ideas Ability to demonstrate self-control will improve Ability to identify and develop effective coping behaviors will improve Ability to maintain clinical measurements within normal limits will improve Compliance with prescribed medications will improve  Medication Management: Evaluate patient's response, side effects, and tolerance of medication regimen.  Therapeutic Interventions: 1 to 1 sessions, Unit Group sessions and Medication administration.  Evaluation of Outcomes: Not Met  Physician Treatment Plan for Secondary Diagnosis: Active Problems:   Severe recurrent major depression without psychotic features ([place])  Long Term Goal(s): Improvement in symptoms so as ready for discharge Improvement in symptoms so as ready for discharge   Short Term Goals: Ability to identify changes in lifestyle to reduce recurrence of condition will improve Ability to verbalize feelings will improve Ability to disclose and discuss suicidal ideas Ability to demonstrate self-control will improve Ability to identify and develop effective coping behaviors will improve Ability to maintain clinical  measurements within normal limits will improve Compliance with prescribed medications will improve Ability to identify changes in lifestyle to reduce recurrence of condition will improve Ability to  verbalize feelings will improve Ability to disclose and discuss suicidal ideas Ability to demonstrate self-control will improve Ability to identify and develop effective coping behaviors will improve Ability to maintain clinical measurements within normal limits will improve Compliance with prescribed medications will improve     Medication Management: Evaluate patient's response, side effects, and tolerance of medication regimen.  Therapeutic Interventions: 1 to 1 sessions, Unit Group sessions and Medication administration.  Evaluation of Outcomes: Not Met   RN Treatment Plan for Primary Diagnosis: <principal problem not specified> Long Term Goal(s): Knowledge of disease and therapeutic regimen to maintain health will improve  Short Term Goals: Ability to verbalize feelings will improve, Ability to disclose and discuss suicidal ideas, Ability to identify and develop effective coping behaviors will improve and Compliance with prescribed medications will improve  Medication Management: RN will administer medications as ordered by provider, will assess and evaluate patient's response and provide education to patient for prescribed medication. RN will report any adverse and/or side effects to prescribing provider.  Therapeutic Interventions: 1 on 1 counseling sessions, Psychoeducation, Medication administration, Evaluate responses to treatment, Monitor vital signs and CBGs as ordered, Perform/monitor CIWA, COWS, AIMS and Fall Risk screenings as ordered, Perform wound care treatments as ordered.  Evaluation of Outcomes: Not Met   LCSW Treatment Plan for Primary Diagnosis: <principal problem not specified> Long Term Goal(s): Safe transition to appropriate next level of care at discharge, Engage patient in therapeutic group addressing interpersonal concerns.  Short Term Goals: Engage patient in aftercare planning with referrals and resources  Therapeutic Interventions: Assess for all  discharge needs, 1 to 1 time with Social worker, Explore available resources and support systems, Assess for adequacy in community support network, Educate family and significant other(s) on suicide prevention, Complete Psychosocial Assessment, Interpersonal group therapy.  Evaluation of Outcomes: Not Met   Progress in Treatment: Attending groups: No. Participating in groups: No. Taking medication as prescribed: Yes. Toleration medication: Yes. Family/Significant other contact made: No, will contact:  if patient consents to collateral contacts Patient understands diagnosis: Yes. Discussing patient identified problems/goals with staff: Yes. Medical problems stabilized or resolved: Yes. Denies suicidal/homicidal ideation: Yes. Issues/concerns per patient self-inventory: No. Other:   New problem(s) identified: None   New Short Term/Long Term Goal(s): medication stabilization, elimination of SI thoughts, development of comprehensive mental wellness plan.    Patient Goals:    Discharge Plan or Barriers: Patient recently admitted. CSW will continue to follow and assess for appropriate referrals and possible discharge planning.    Reason for Continuation of Hospitalization: Anxiety Depression Medication stabilization Suicidal ideation  Estimated Length of Stay: 3-5 days   Attendees: Patient: 10/06/2019 8:57 AM  Physician: Dr. Neita Garnet, MD 10/06/2019 8:57 AM  Nursing: Rise Paganini.Raliegh Ip, RN 10/06/2019 8:57 AM  RN Care Manager: 10/06/2019 8:57 AM  Social Worker: Radonna Ricker, LCSW 10/06/2019 8:57 AM  Recreational Therapist:  10/06/2019 8:57 AM  Other:  10/06/2019 8:57 AM  Other:  10/06/2019 8:57 AM  Other: 10/06/2019 8:57 AM    Scribe for Treatment Team: Marylee Floras, [place] 10/06/2019 8:57 AM

## 2019-10-06 NOTE — Progress Notes (Addendum)
Spiritual care group on grief and loss facilitated by chaplain Jerene Pitch MDiv, [facility]  Group Goal:  Support / Education around grief and loss Members engage in facilitated group support and psycho-social education.  Group Description:  Following introductions and group rules, group members engaged in facilitated group dialog and support around topic of loss, with particular support around experiences of loss in their lives. Group Identified types of loss (relationships / self / things) and identified patterns, circumstances, and changes that precipitate losses. Reflected on thoughts / feelings around loss, normalized grief responses, and recognized variety in grief experience.   Group noted Worden's four tasks of grief in discussion.  Group drew on Adlerian / Rogerian, narrative, MI, Patient Progress:  Theresa Ochoa was present throughout group.  Engaged in discussion around grief process, noting the death of her mother in early 2019/02/09 and miscarriage of her daughter two weeks later.  Favour noted that 10 years prior she lost her brother and another relative two weeks apart.    In speaking of grief - she noted messages of "being strong" /  "immoveable"  which she hears from family and noted how she feels "weak" if she reaches out for care.  She received affirmation and normalization from group.    Theresa Ochoa expressed some frustration with living in hospital and her children being cared for in [place] by relatives.  Group provided affirmation around finding care for her children even while going through challenging times.  Theresa Ochoa spoke with group about her initiative to engage in group and received affirmation for trying something new.  She is interested in counseling options following [place] hospitalization.

## 2019-10-06 NOTE — [facility] Group Notes (Signed)
LCSW Group Therapy Notes  Type of Therapy and Topic: Group Therapy: Core Beliefs  Participation Level: Did Not Attend  Description of Group: In this group patients will be encouraged to explore their negative and positive core beliefs about themselves, others, and the world. Each patient will be challenged to identify these beliefs and ways to challenge negative core beliefs. This group will be process-oriented, with patients participating in exploration of their own experiences as well as giving and receiving support and challenge from other group members.  Therapeutic Goals: 1. Patient will identify personal core beliefs, both negative and positive. 2. Patient will identify core beliefs relating to others, both negative and positive. 3. Patient will challenge their negative beliefs about themselves and others. 4. Patient will identify three changes they can make to replace negative core beliefs with positive beliefs.  Summary of Patient Progress  Due to the COVID-19 pandemic, this group has been supplemented with worksheets.  Therapeutic Modalities: Cognitive Behavioral Therapy Solution Focused Therapy Motivational Interviewing  

## 2019-10-06 NOTE — Progress Notes (Signed)
   10/06/19 2338  Psych Admission Type (Psych Patients Only)  Admission Status Voluntary  Psychosocial Assessment  Patient Complaints Anxiety  Eye Contact Brief  Facial Expression Anxious  Affect Anxious  Speech Logical/coherent  Interaction Other (Comment) (appropriate)  Motor Activity Other (Comment) (appropriate)  Appearance/Hygiene Unremarkable  Behavior Characteristics Cooperative  Mood Depressed  Thought Process  Coherency WDL  Content WDL  Delusions None reported or observed  Perception WDL  Hallucination None reported or observed  Judgment WDL  Confusion None  Danger to Self  Current suicidal ideation? Denies  Danger to Others  Danger to Others None reported or observed  D: Patient A & O x 4. Pt reports "today is one of my better days".  A: Medications administered as prescribed. Support and encouragement provided as needed.  R: Patient remains safe on the unit. Will continue to monitor for safety and stability.

## 2019-10-06 NOTE — Progress Notes (Signed)
D:  Patient denied SI and HI, contracts for safety.  Denied A/V hallucinations.  Denied pain. A:  Medications administered per MD orders.  Emotional support and encouragement given patient. R:  Safety maintained with 15 minute checks.  
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
## 2019-10-06 NOTE — Plan of Care (Signed)
Nurse discussed anxiety, depression and coping skills with patient.  

## 2019-10-07 LAB — BASIC METABOLIC PANEL
Anion gap: 13 (ref 5–15)
BUN: 13 mg/dL (ref 6–20)
CO2: 23 mmol/L (ref 22–32)
Calcium: 10.1 mg/dL (ref 8.9–10.3)
Chloride: 100 mmol/L (ref 98–111)
Creatinine, Ser: 0.87 mg/dL (ref 0.44–1.00)
GFR calc Af Amer: >60 mL/min (ref >60)
GFR calc non Af Amer: >60 mL/min (ref >60)
Glucose, Bld: 110 mg/dL — ABNORMAL HIGH (ref 70–99)
Potassium: 3.7 mmol/L (ref 3.5–5.1)
Sodium: 136 mmol/L (ref 135–145)

## 2019-10-07 MED ORDER — NICOTINE POLACRILEX 2 MG MT GUM
2.0000 mg | CHEWING_GUM | OROMUCOSAL | Status: DC | PRN
  Administered 2019-10-07: 21:00:00 2 mg via ORAL

## 2019-10-07 NOTE — Progress Notes (Signed)
[facility]  10/07/2019 11:25 AM Theresa Ochoa  MRN:  [id number] Subjective:  "I'm good."  Ms. Heidinger found sitting in the dayroom. She presents with euthymic affect and reports mood continues to improve. She feels group therapy as well as medication has been helpful. She shares that she is working on taking care of herself before taking care of others. Anxiety levels are decreasing. She has been on the phone working on securing housing for herself and her children. She has spoken to a woman who is holding a place for after her hotel stay ends. Appetite is also improved. She ate lunch and dinner yesterday and had an Ensure this morning. She denies medication side effects. She reports good sleep overnight. She has been visible interacting appropriately on the unit.  From admission H&P: [age] y old female, presented to the hospital voluntarily , reporting worsening depression, anxiety, and reports " it's like there is this dark cloud over me". Endorses passive suicidal ideations , with thoughts of " giving up, not caring if I die" but denies suicidal plans or intentions.  Principal Problem: <principal problem not specified> Diagnosis: Active Problems:   Severe recurrent major depression without psychotic features ([facility])  Total Time spent with patient: 15 minutes  Past Psychiatric History: See admission H&P  Past Medical History:  Past Medical History:  Diagnosis Date  . Anxiety     Past Surgical History:  Procedure Laterality Date  . CESAREAN SECTION     x 3   Family History: History reviewed. No pertinent family history. Family Psychiatric  History: See admission H&P Social History:  Social History   Substance and Sexual Activity  Alcohol Use Yes   Comment: occ     Social History   Substance and Sexual Activity  Drug Use Not Currently  . Types: Marijuana    Social History   Socioeconomic History  . Marital status: Single    Spouse name: Not on file  . Number of  children: Not on file  . Years of education: Not on file  . Highest education level: Not on file  Occupational History  . Not on file  Tobacco Use  . Smoking status: Former Games developermoker  . Smokeless tobacco: Never Used  Substance and Sexual Activity  . Alcohol use: Yes    Comment: occ  . Drug use: Not Currently    Types: Marijuana  . Sexual activity: Yes  Other Topics Concern  . Not on file  Social History Narrative  . Not on file   Social Determinants of Health   Financial Resource Strain:   . Difficulty of Paying Living Expenses: Not on file  Food Insecurity:   . Worried About Programme researcher, broadcasting/film/videounning Out of Food in the Last Year: Not on file  . Ran Out of Food in the Last Year: Not on file  Transportation Needs:   . Lack of Transportation (Medical): Not on file  . Lack of Transportation (Non-Medical): Not on file  Physical Activity:   . Days of Exercise per Week: Not on file  . Minutes of Exercise per Session: Not on file  Stress:   . Feeling of Stress : Not on file  Social Connections:   . Frequency of Communication with Friends and Family: Not on file  . Frequency of Social Gatherings with Friends and Family: Not on file  . Attends Religious Services: Not on file  . Active Member of Clubs or Organizations: Not on file  . Attends BankerClub or Organization Meetings:  Not on file  . Marital Status: Not on file   Additional Social History:                         Sleep: Good  Appetite:  Good  Current Medications: Current Facility-Administered Medications  Medication Dose Route Frequency Provider Last Rate Last Admin  . acetaminophen (TYLENOL) tablet 650 mg  650 mg Oral Q6H PRN Nira Conn A, NP      . alum & mag hydroxide-simeth (MAALOX/MYLANTA) 200-200-20 MG/5ML suspension 30 mL  30 mL Oral Q4H PRN Nira Conn A, NP      . feeding supplement (ENSURE ENLIVE) (ENSURE ENLIVE) liquid 237 mL  237 mL Oral BID BM Cobos, Rockey Situ, MD   237 mL at 10/07/19 0823  . hydrOXYzine  (ATARAX/VISTARIL) tablet 25 mg  25 mg Oral TID PRN Jackelyn Poling, NP   25 mg at 10/06/19 2116  . magnesium hydroxide (MILK OF MAGNESIA) suspension 30 mL  30 mL Oral Daily PRN Nira Conn A, NP      . sertraline (ZOLOFT) tablet 50 mg  50 mg Oral Daily Cobos, Rockey Situ, MD   50 mg at 10/07/19 0823  . traZODone (DESYREL) tablet 50 mg  50 mg Oral QHS PRN Jackelyn Poling, NP   50 mg at 10/06/19 2116    Lab Results: No results found for this or any previous visit (from the past 48 hour(s)).  Blood Alcohol level:  Lab Results  Component Value Date   ETH <10 10/04/2019   ETH 21 (H) 05/09/2018    Metabolic Disorder Labs: Lab Results  Component Value Date   HGBA1C 5.3 10/05/2019   MPG 105.41 10/05/2019   MPG 116.89 11/16/2018   No results found for: PROLACTIN Lab Results  Component Value Date   CHOL 171 10/05/2019   TRIG 60 10/05/2019   HDL 47 10/05/2019   CHOLHDL 3.6 10/05/2019   VLDL 12 10/05/2019   LDLCALC 112 (H) 10/05/2019    Physical Findings: AIMS: Facial and Oral Movements Muscles of Facial Expression: None, normal Lips and Perioral Area: None, normal Jaw: None, normal Tongue: None, normal,Extremity Movements Upper (arms, wrists, hands, fingers): None, normal Lower (legs, knees, ankles, toes): None, normal, Trunk Movements Neck, shoulders, hips: None, normal, Overall Severity Severity of abnormal movements (highest score from questions above): None, normal Incapacitation due to abnormal movements: None, normal Patient's awareness of abnormal movements (rate only patient's report): No Awareness, Dental Status Current problems with teeth and/or dentures?: No Does patient usually wear dentures?: No  CIWA:  CIWA-Ar Total: 1 COWS:  COWS Total Score: 2  Musculoskeletal: Strength & Muscle Tone: within normal limits Gait & Station: normal Patient leans: N/A  Psychiatric Specialty Exam: Physical Exam  Nursing note and vitals reviewed. Constitutional: She is oriented  to person, place, and time. She appears well-developed and well-nourished.  Respiratory: Effort normal.  Neurological: She is alert and oriented to person, place, and time.    Review of Systems  Constitutional: Negative.   Respiratory: Negative for cough and shortness of breath.   Psychiatric/Behavioral: Negative for agitation, behavioral problems, dysphoric mood, hallucinations, self-injury, sleep disturbance and suicidal ideas. The patient is not nervous/anxious.     Blood pressure 108/83, pulse (!) 127, temperature 98.7 F (37.1 C), temperature source Oral, resp. rate 16, height 5\' 7"  (1.702 m), weight 68 kg, SpO2 99 %, unknown if currently breastfeeding.Body mass index is 23.49 kg/m.  General Appearance: Casual  Eye Contact:  Good  Speech:  Normal Rate  Volume:  Normal  Mood:  Euthymic  Affect:  Appropriate and Congruent  Thought Process:  Coherent and Goal Directed  Orientation:  Full (Time, Place, and Person)  Thought Content:  Logical  Suicidal Thoughts:  No  Homicidal Thoughts:  No  Memory:  Immediate;   Good Recent;   Good  Judgement:  Intact  Insight:  Fair  Psychomotor Activity:  Normal  Concentration:  Concentration: Good and Attention Span: Good  Recall:  Good  Fund of Knowledge:  Fair  Language:  Good  Akathisia:  No  Handed:  Right  AIMS (if indicated):     Assets:  Communication Skills Desire for Improvement Housing Resilience  ADL's:  Intact  Cognition:  WNL  Sleep:  Number of Hours: 6.75     Treatment Plan Summary: Daily contact with patient to assess and evaluate symptoms and progress in treatment and Medication management   Continue inpatient hospitalization.  Continue Zoloft 50 mg PO daily for depression/anxiety Continue Vistaril 25 mg PO TID PRN anxiety Continue trazodone 50 mg PO QHS PRN insomnia Continue Ensure Enlive PO BID for supplementation  Patient will participate in the therapeutic group milieu.  Discharge disposition in  progress.   Connye Burkitt, NP 10/07/2019, 11:25 AM

## 2019-10-07 NOTE — Progress Notes (Signed)
   10/07/19 2343  Psych Admission Type (Psych Patients Only)  Admission Status Voluntary  Psychosocial Assessment  Patient Complaints None  Eye Contact Fair  Facial Expression Other (Comment) (smiles)  Affect Anxious  Speech Logical/coherent  Interaction Assertive  Motor Activity Other (Comment) (WDL)  Appearance/Hygiene Unremarkable  Behavior Characteristics Cooperative  Mood Pleasant  Thought Process  Coherency WDL  Content WDL  Delusions None reported or observed  Perception WDL  Hallucination None reported or observed  Judgment WDL  Confusion WDL  Danger to Self  Current suicidal ideation? Denies  Danger to Others  Danger to Others None reported or observed  D: Patient in dayroom interacting well with peers. Pt reports she had a good day and feels good. Pt reports goal is to discharge tomorrow. A: Medications administered as prescribed. Support and encouragement provided as needed.  R: Patient remains safe on the unit. Will continue to monitor for safety and stability.

## 2019-10-07 NOTE — [facility] Suicide Risk Assessment (Signed)
[facility] INPATIENT:  Family/Significant Other Suicide Prevention Education  Suicide Prevention Education:  Contact Attempts: with husband, Alean Rinne [phone]) has been identified by the patient as the family member/significant other with whom the patient will be residing, and identified as the person(s) who will aid the patient in the event of a mental health crisis.  With written consent from the patient, two attempts were made to provide suicide prevention education, prior to and/or following the patient's discharge.  We were unsuccessful in providing suicide prevention education.  A suicide education pamphlet was given to the patient to share with family/significant other.  Date and time of first attempt:10/07/19/ 10:51am (Unable to leave voicemail due to voicemail not being set up at this time); Will attempt again at a later time.      Marylee Floras 10/07/2019, 10:51 AM

## 2019-10-08 DIAGNOSIS — F322 Major depressive disorder, single episode, severe without psychotic features: Secondary | ICD-10-CM

## 2019-10-08 MED ORDER — TRAZODONE HCL 50 MG PO TABS: 50.0000 mg | ORAL_TABLET | Freq: Every evening | ORAL | 0 refills | Status: AC | PRN

## 2019-10-08 MED ORDER — NICOTINE POLACRILEX 2 MG MT GUM: 2.0000 mg | CHEWING_GUM | OROMUCOSAL | 0 refills | Status: AC | PRN

## 2019-10-08 MED ORDER — SERTRALINE HCL 50 MG PO TABS: 50.0000 mg | ORAL_TABLET | Freq: Every day | ORAL | 0 refills | Status: AC

## 2019-10-08 MED ORDER — ENSURE ENLIVE PO LIQD: 237.0000 mL | Freq: Two times a day (BID) | ORAL | 0 refills | Status: AC

## 2019-10-08 MED ORDER — HYDROXYZINE HCL 25 MG PO TABS: 25.0000 mg | ORAL_TABLET | Freq: Three times a day (TID) | ORAL | 0 refills | Status: AC | PRN

## 2019-10-08 NOTE — Progress Notes (Signed)
   10/08/19 1000  Psych Admission Type (Psych Patients Only)  Admission Status Voluntary  Psychosocial Assessment  Patient Complaints None  Eye Contact Fair  Facial Expression Flat  Affect Appropriate to circumstance  Speech Logical/coherent  Interaction Assertive  Motor Activity Other (Comment) (WNL)  Appearance/Hygiene Unremarkable  Behavior Characteristics Appropriate to situation  Mood Pleasant;Euthymic  Aggressive Behavior  Effect No apparent injury  Thought Process  Coherency WDL  Content WDL  Delusions None reported or observed  Perception WDL  Hallucination None reported or observed  Judgment WDL  Confusion WDL  Danger to Self  Current suicidal ideation? Denies  Danger to Others  Danger to Others None reported or observed
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
## 2019-10-08 NOTE — BHH Suicide Risk Assessment (Signed)
[facility] Discharge Suicide Risk Assessment   Principal Problem: <principal problem not specified> Discharge Diagnoses: Active Problems:   Severe recurrent major depression without psychotic features ([place])   Total Time spent with patient: 20 minutes  Musculoskeletal: Strength & Muscle Tone: within normal limits Gait & Station: normal Patient leans: N/A  Psychiatric Specialty Exam: Review of Systems  All other systems reviewed and are negative.   Blood pressure 106/90, pulse (!) 111, temperature 97.9 F (36.6 C), resp. rate 16, height 5\' 7"  (1.702 m), weight 68 kg, SpO2 99 %, unknown if currently breastfeeding.Body mass index is 23.49 kg/m.  General Appearance: Casual  Eye Contact::  Good  Speech:  Normal Rate409  Volume:  Normal  Mood:  Euthymic  Affect:  Congruent  Thought Process:  Coherent and Descriptions of Associations: Intact  Orientation:  Full (Time, Place, and Person)  Thought Content:  Logical  Suicidal Thoughts:  No  Homicidal Thoughts:  No  Memory:  Immediate;   Good Recent;   Good Remote;   Good  Judgement:  Intact  Insight:  Good  Psychomotor Activity:  Normal  Concentration:  Good  Recall:  Good  Fund of Knowledge:Good  Language: Good  Akathisia:  Negative  Handed:  Right  AIMS (if indicated):     Assets:  Communication Skills Desire for Improvement Resilience Talents/Skills  Sleep:  Number of Hours: 6.75  Cognition: WNL  ADL's:  Intact   Mental Status Per Nursing Assessment::   On Admission:  NA  Demographic Factors:  Low socioeconomic status and Unemployed  Loss Factors: Financial problems/change in socioeconomic status  Historical Factors: Impulsivity  Risk Reduction Factors:   Responsible for children under 40 years of age, Sense of responsibility to family, Living with another person, especially a relative and Positive coping skills or problem solving skills  Continued Clinical Symptoms:  Depression:   Impulsivity  Cognitive  Features That Contribute To Risk:  None    Suicide Risk:  Minimal: No identifiable suicidal ideation.  Patients presenting with no risk factors but with morbid ruminations; may be classified as minimal risk based on the severity of the depressive symptoms  Follow-up Information    [facility]. Go on 10/15/2019.   Why: Your hospital discharge appointment is on Wednesday, January 6th at 1:00pm. It will be via telephone.  Contact information: [address] [place] [address] [phone]        [facility] Follow up.   Specialty: Hospice and Palliative Medicine Contact information: [place] [address] [phone]           Plan Of Care/Follow-up recommendations:  Activity:  ad lib  Sharma Covert, MD 10/08/2019, 9:40 AM

## 2019-10-08 NOTE — BHH Suicide Risk Assessment (Signed)
[place] INPATIENT:  Family/Significant Other Suicide Prevention Education  Suicide Prevention Education:  Education Completed; husband, Alean Rinne [phone]) has been identified by the patient as the family member/significant other with whom the patient will be residing, and identified as the person(s) who will aid the patient in the event of a mental health crisis (suicidal ideations/suicide attempt).  With written consent from the patient, the family member/significant other has been provided the following suicide prevention education, prior to the and/or following the discharge of the patient.  The suicide prevention education provided includes the following:  Suicide risk factors  Suicide prevention and interventions  National Suicide Hotline telephone number  [facility] assessment telephone number  [facility] Emergency Assistance [place] and/or Residential Mobile Crisis Unit telephone number  Request made of family/significant other to:  Remove weapons (e.g., guns, rifles, knives), all items previously/currently identified as safety concern.    Remove drugs/medications (over-the-counter, prescriptions, illicit drugs), all items previously/currently identified as a safety concern.  The family member/significant other verbalizes understanding of the suicide prevention education information provided.  The family member/significant other agrees to remove the items of safety concern listed above.   Husband reports patient plans to return to a hotel. There are no guns or weapons in the hotel.  Their situation is complicated as the patient has taken a 50-B out on her husband, but they talk daily and he is her main source of support. Husband also financially supports their children and patient's younger brother, who she has assumed care of following the death of their mother.  Husband expressed concerns for New Years Eve and New Years Day, as it is the one  year anniversary of her mother's passing. He worries how she will handle the anniversary.   Husband shared patient's losses and hardships in great detail. He wants to be "the good guy," and support her but he shares it is difficult when she took out a 50-B that he states is false, and she engages in self-sabotaging behaviors.  Husband also expressed several concerns of their shared children, patient's other children, and patient's younger brother- as they have not received counseling or support in grieving as well. CSW placed resources for [facility] through [place] on patient's chart.  Joellen Jersey 10/08/2019, 11:41 AM

## 2019-10-08 NOTE — Progress Notes (Signed)
Recreation Therapy Notes  Date:  12.30.20 Time: 0930 Location: [address] Dayroom  Group Topic: Stress Management  Goal Area(s) Addresses:  Patient will identify positive stress management techniques. Patient will identify benefits of using stress management post d/c.  Behavioral Response:  Engaged  Intervention: Stress Management  Activity :  Guided Imagery.  LRT read a script that guided patients on a journey to their peaceful place.  Patients were to envision all of the things their peaceful place has to offer.  Patients were to listen and follow along as script was read.  Education:  Stress Management, Discharge Planning.   Education Outcome: Acknowledges Education  Clinical Observations/Feedback: Pt attended and participated in activity.    Victorino Sparrow, LRT/CTRS         Victorino Sparrow A 10/08/2019 10:56 AM

## 2019-10-08 NOTE — Progress Notes (Signed)
Pt received both written and verbal discharge instructions. Pt verbal understanding of discharge instructions. Pt agreed to f/u appt and med regimen. Pt received SRA, AVS, transitional record and belongings from locker. Pt safely discharged to the lobby.

## 2020-12-14 IMAGING — CR DG CHEST 2V
2 series · 2 of 2 positions shown · non-contrast
Comparison: None.

CLINICAL DATA: Chest pain and cough

EXAM:
CHEST - 2 VIEW

[w chest pa]
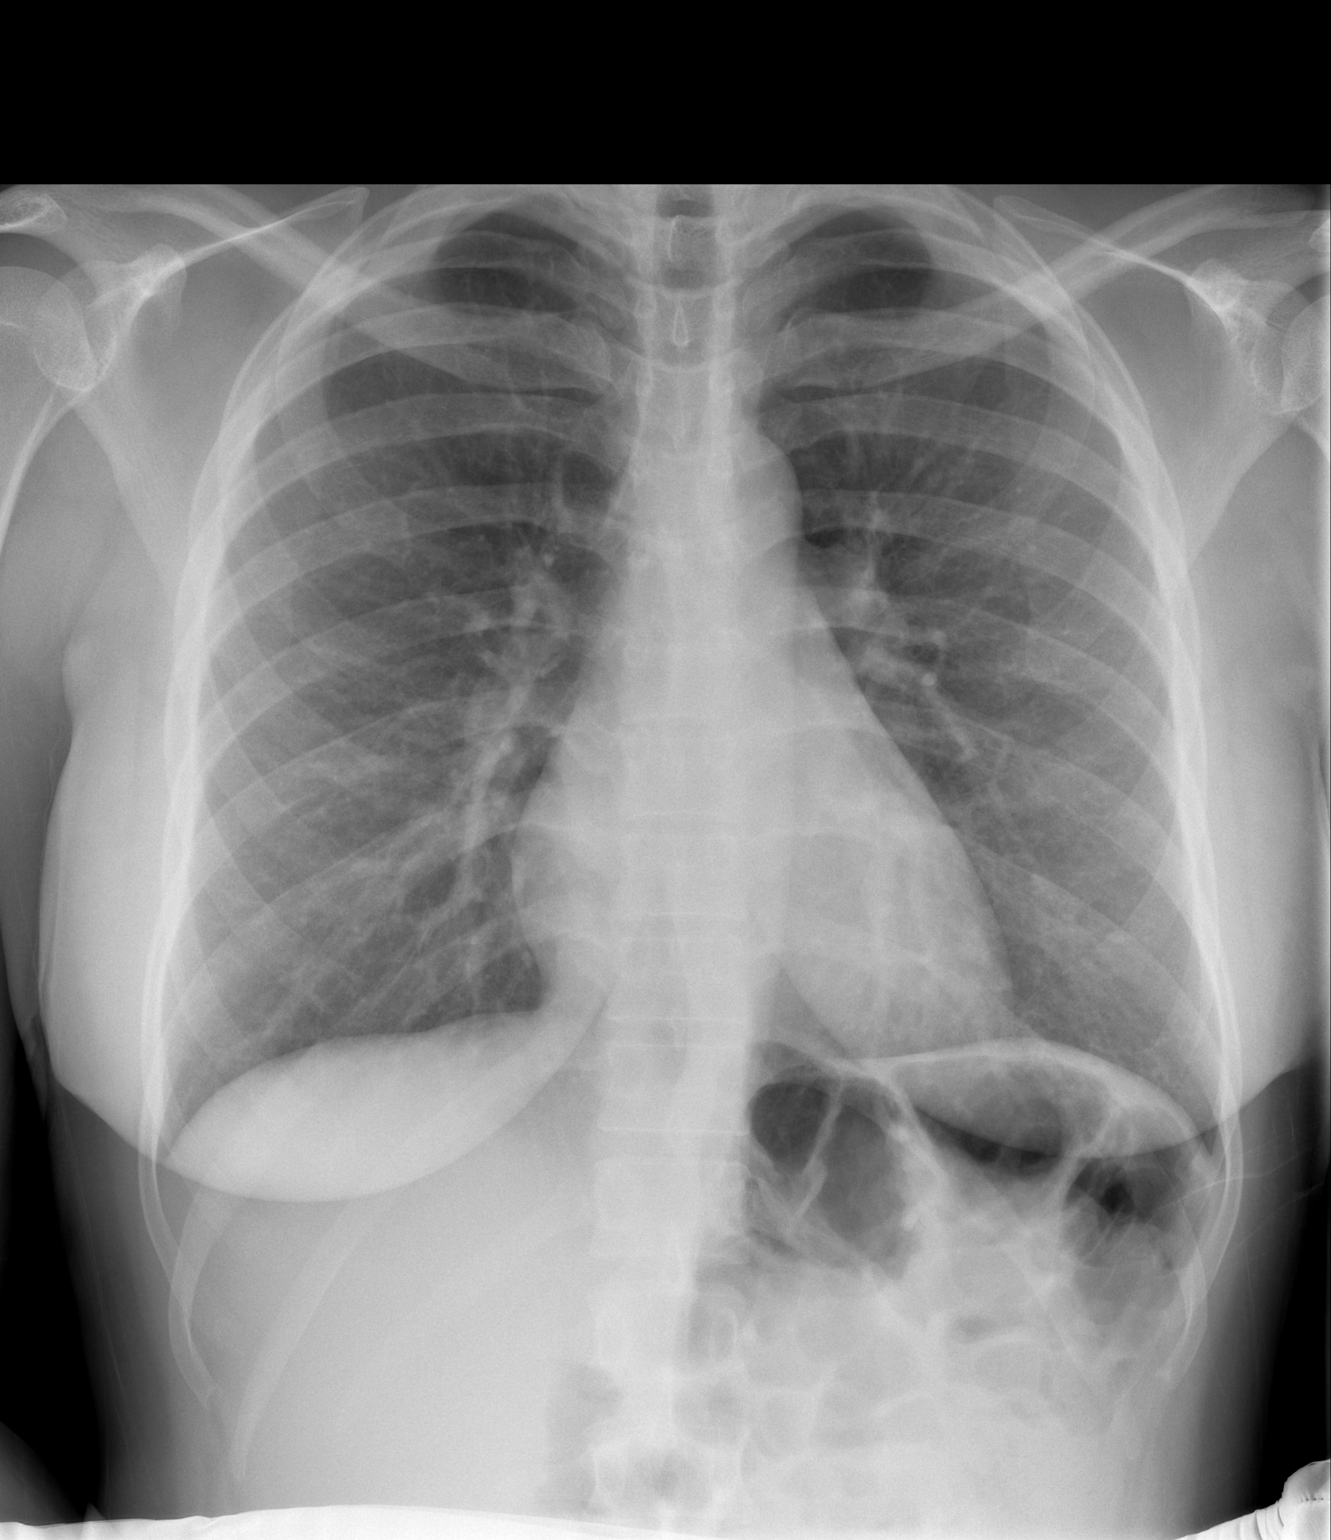

[w chest lat]
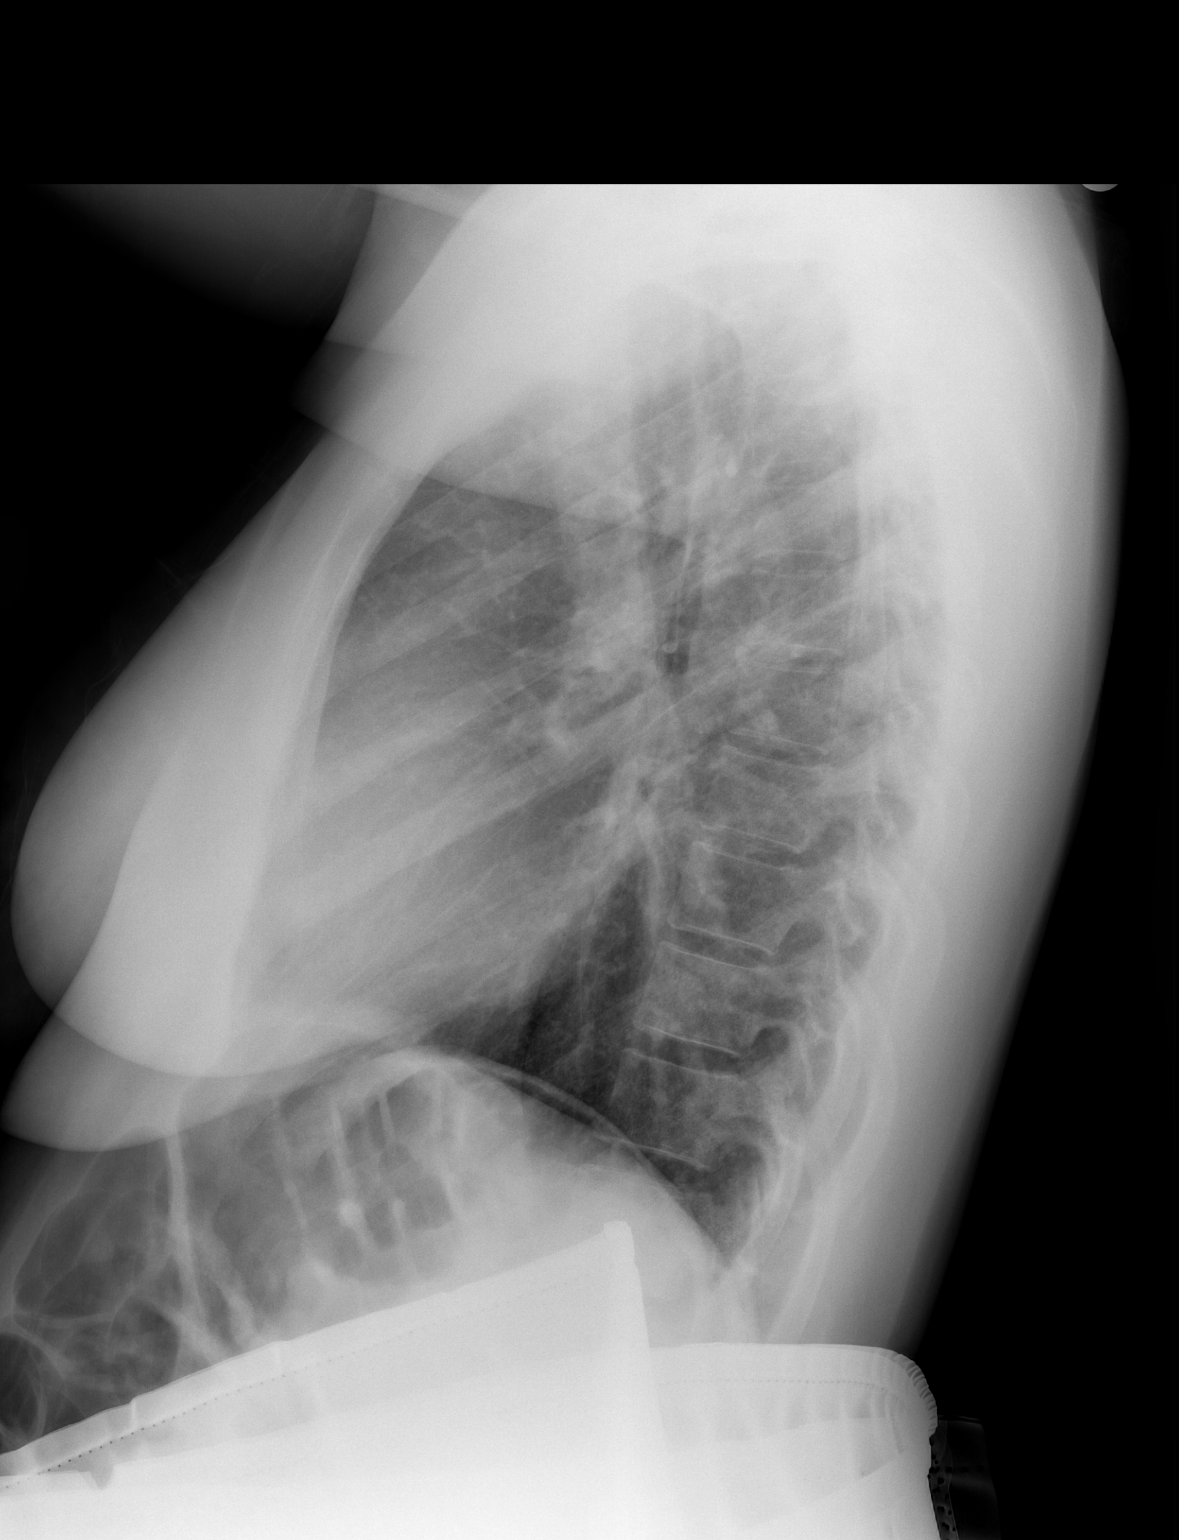

[2 of 2 positions shown; findings below may reference images not displayed]

FINDINGS: Lungs are clear. Heart size and pulmonary vascularity are normal. No
adenopathy. No pneumothorax. No bone lesions.
IMPRESSION: No edema or consolidation.

## 2021-12-10 IMAGING — DX DG CHEST 2V
3 series · 3 of 3 positions shown · non-contrast
Comparison: 10/08/2018

CLINICAL DATA: Chest pain for 2 weeks, increased pain when lying
down, former smoker, lot of stress

EXAM:
CHEST - 2 VIEW

[chest pa]
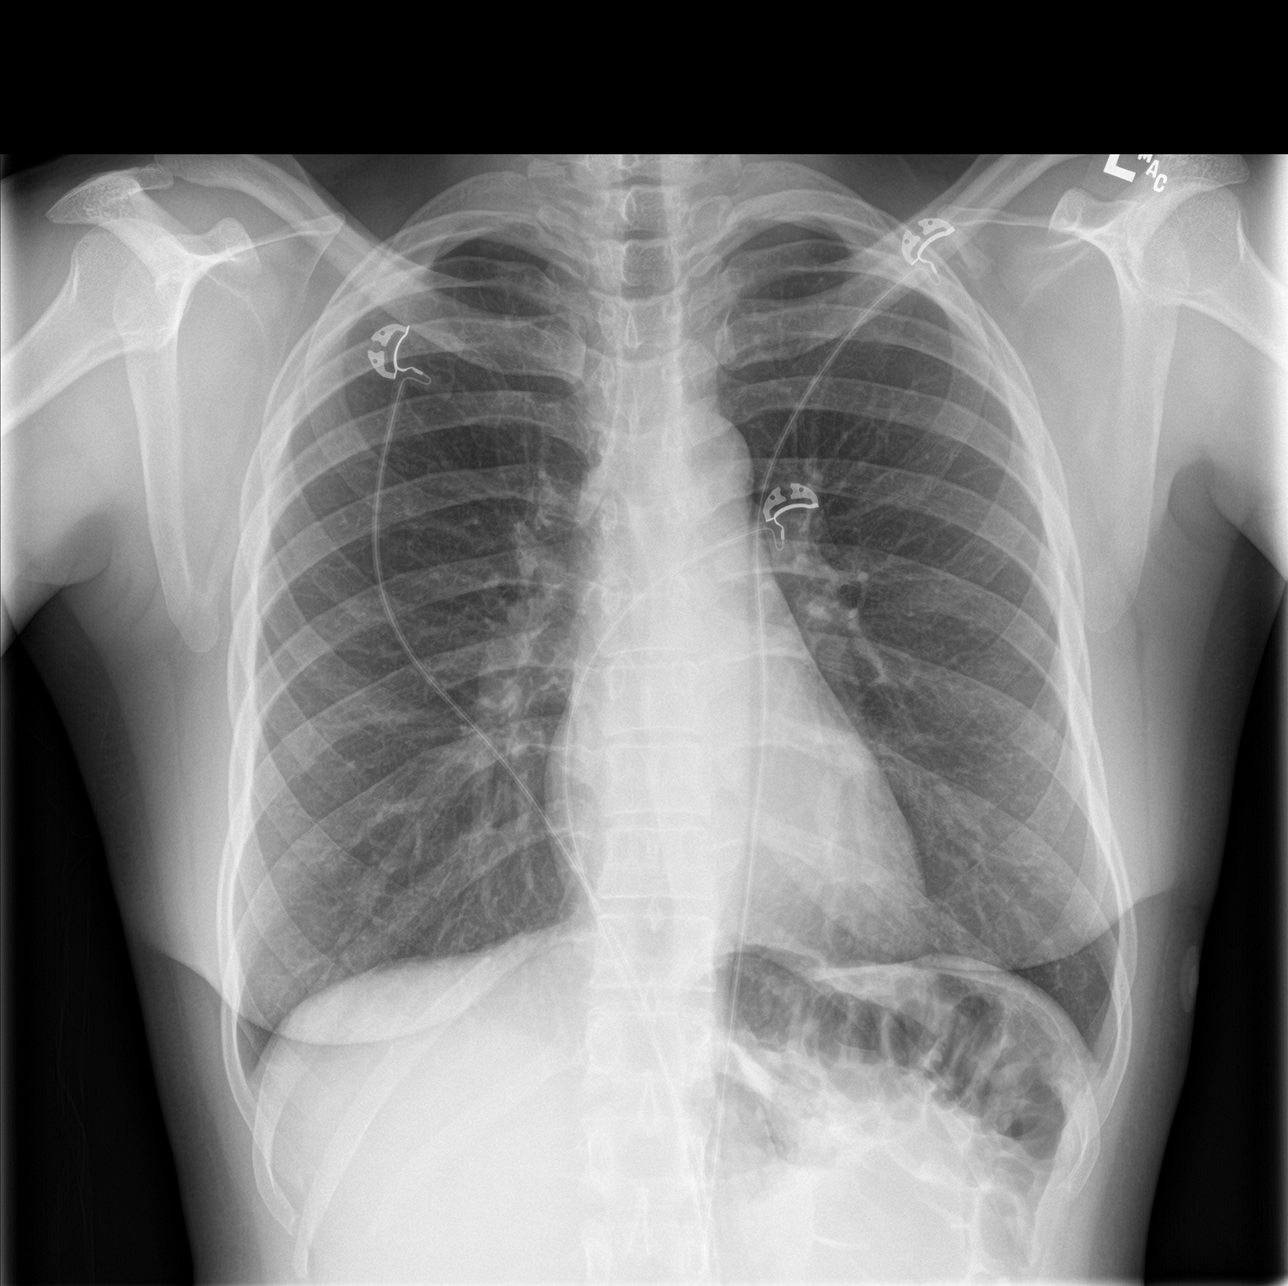

[chest lat (1 of 2)]
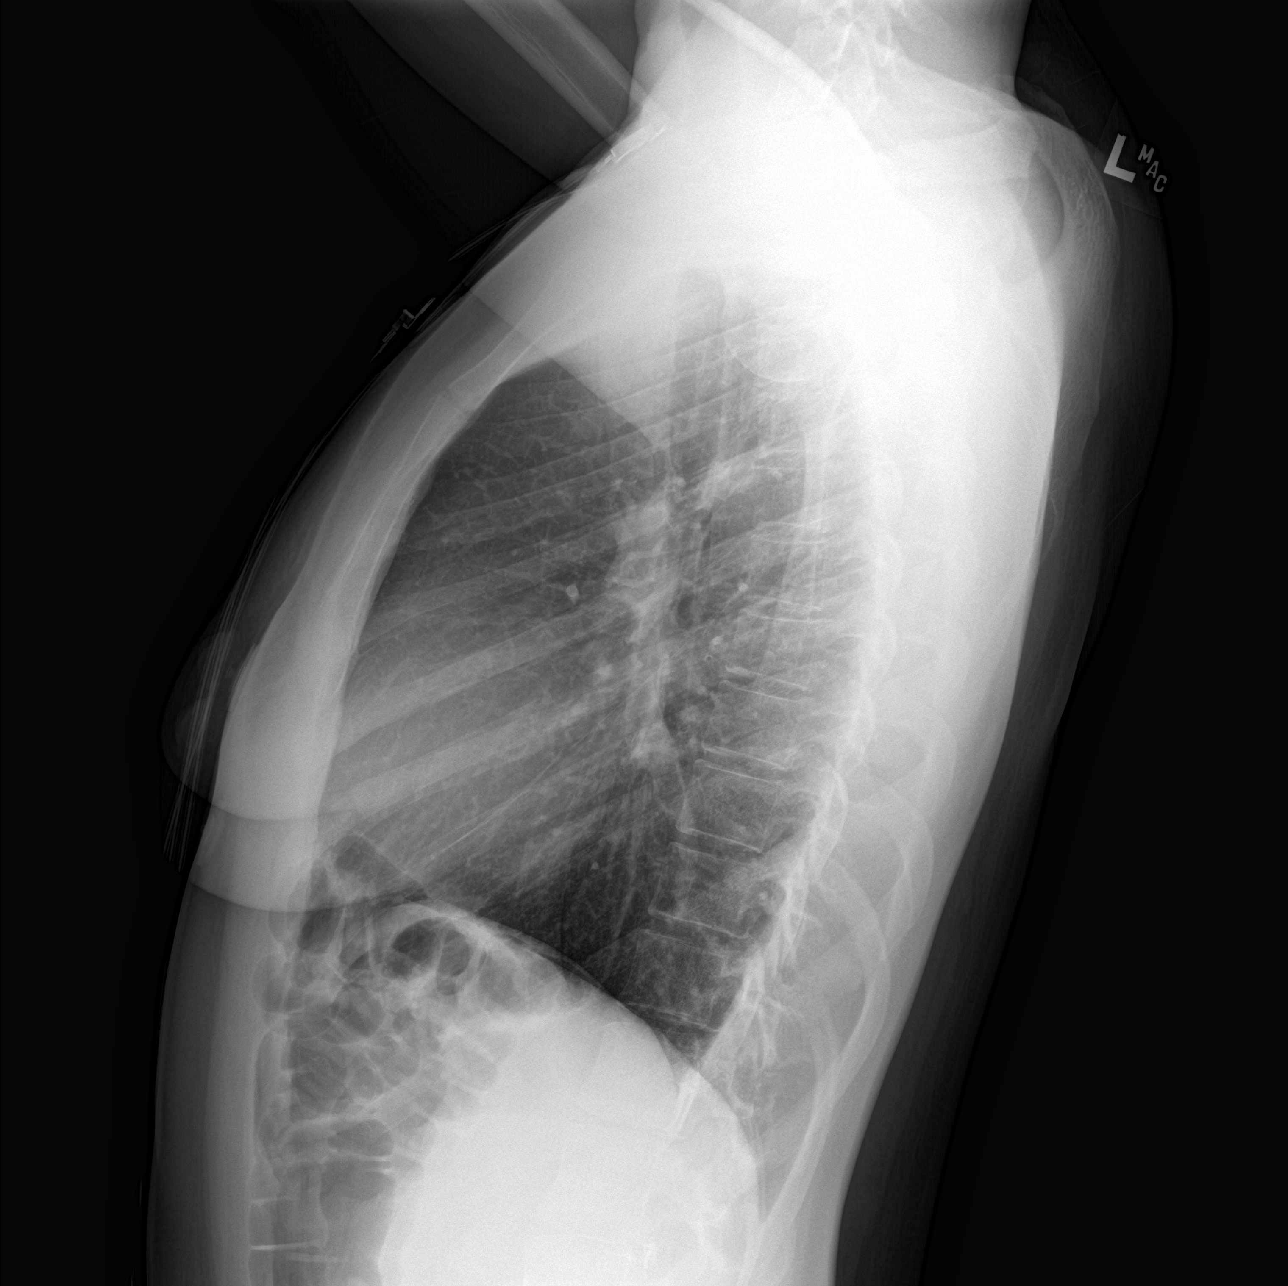

[chest lat (2 of 2)]
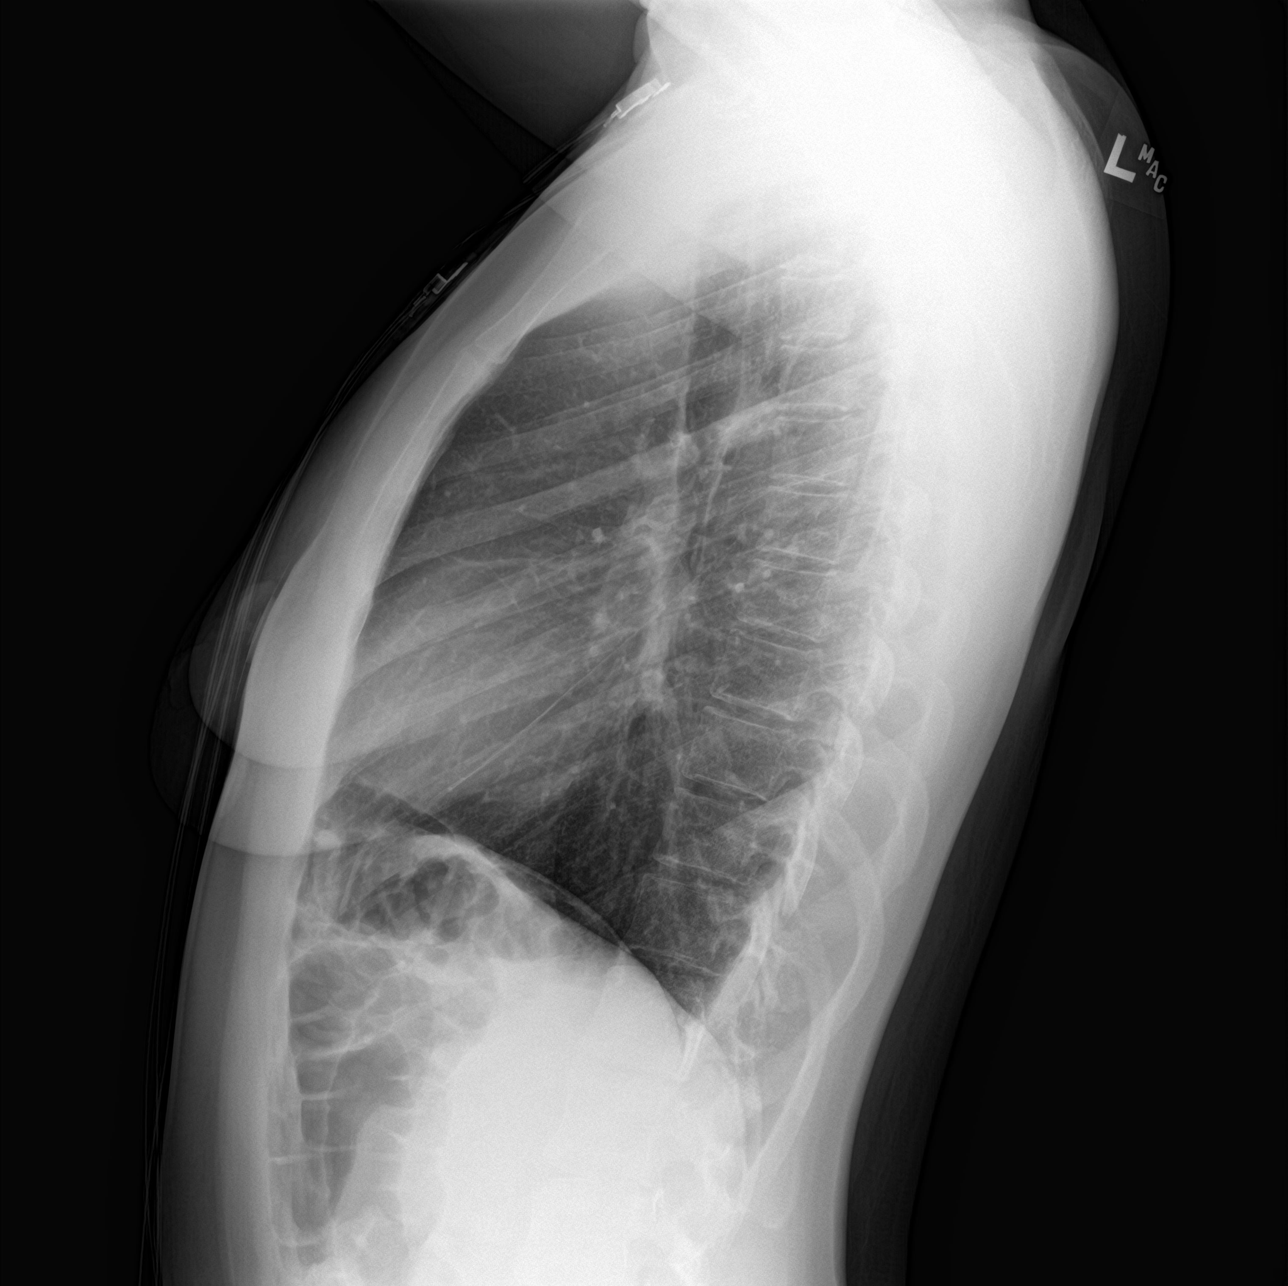

[3 of 3 positions shown; findings below may reference images not displayed]

FINDINGS: Normal heart size, mediastinal contours, and pulmonary vascularity.

Lungs clear.

No pleural effusion or pneumothorax.

Bones unremarkable.
IMPRESSION: Normal exam.

## 2024-11-17 ENCOUNTER — Encounter: Payer: Self-pay | Admitting: Obstetrics and Gynecology
